# Patient Record
Sex: Male | Born: 2000 | Race: Black or African American | Hispanic: No | Marital: Single | Smoking: Never smoker
Health system: Southern US, Community
[De-identification: ages and names within clinical notes are randomized; demographics above are authoritative.]

## PROBLEM LIST (undated history)

## (undated) DIAGNOSIS — J45909 Unspecified asthma, uncomplicated: Secondary | ICD-10-CM

## (undated) HISTORY — PX: WISDOM TOOTH EXTRACTION: SHX21

---
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
## 2020-02-17 ENCOUNTER — Ambulatory Visit: Payer: Medicaid Other | Admitting: Physician Assistant

## 2020-02-17 ENCOUNTER — Other Ambulatory Visit: Payer: Self-pay

## 2020-02-17 ENCOUNTER — Encounter: Payer: Self-pay | Admitting: Physician Assistant

## 2020-02-17 DIAGNOSIS — Z113 Encounter for screening for infections with a predominantly sexual mode of transmission: Secondary | ICD-10-CM | POA: Diagnosis not present

## 2020-02-17 LAB — GRAM STAIN

## 2020-02-17 NOTE — Progress Notes (Signed)
Pt here for STD screening.Kristain Filo, RN 

## 2020-02-17 NOTE — Progress Notes (Signed)
Gram stain reviewed and is negative today, so no treatment needed for gram stain per standing order. Provider orders completed.Lyman Speller, RN

## 2020-02-19 NOTE — Progress Notes (Signed)
   [facility] Department STI clinic/screening visit  Subjective:  Adam Little is a 19 y.o. male being seen today for an STI screening visit. The patient reports they do not have symptoms.    Patient has the following medical conditions:   Patient Active Problem List   Diagnosis Date Noted  . Rhabdomyolysis 03/13/2017     Chief Complaint  Patient presents with  . SEXUALLY TRANSMITTED DISEASE    STD screening    HPI  Patient reports that he does not have any symptoms but would like a screening.  Never had HIV testing done before.  History of asthma and has an inhaler to use if needed.   See flowsheet for further details and programmatic requirements.    The following portions of the patient's history were reviewed and updated as appropriate: allergies, current medications, past medical history, past social history, past surgical history and problem list.  Objective:  There were no vitals filed for this visit.  Physical Exam Constitutional:      General: He is not in acute distress.    Appearance: Normal appearance.  HENT:     Head: Normocephalic and atraumatic.     Comments: No nits, lice, or hair loss. No cervical, supraclavicular or axillary adenopathy.    Mouth/Throat:     Mouth: Mucous membranes are moist.     Pharynx: Oropharynx is clear. No oropharyngeal exudate or posterior oropharyngeal erythema.  Eyes:     Conjunctiva/sclera: Conjunctivae normal.  Pulmonary:     Effort: Pulmonary effort is normal.  Abdominal:     Palpations: Abdomen is soft. There is no mass.     Tenderness: There is no abdominal tenderness. There is no guarding or rebound.  Genitourinary:    Penis: Normal.      Testes: Normal.     Comments: Pubic area without nits, lice, edema, erythema, lesions and inguinal adenopathy. Penis uncircumcised, without rash, lesion and discharge at meatus. Musculoskeletal:     Cervical back: Neck supple. No tenderness.  Skin:    General:  Skin is warm and dry.     Findings: No bruising, erythema, lesion or rash.  Neurological:     Mental Status: He is alert and oriented to person, place, and time.  Psychiatric:        Mood and Affect: Mood normal.        Thought Content: Thought content normal.        Judgment: Judgment normal.       Assessment and Plan:  Adam Little is a 19 y.o. male presenting to the [facility] Department for STI screening  1. Screening for STD (sexually transmitted disease) Patient into clinic without symptoms. Rec condoms with all sex. Await test results.  Counseled that RN will call if needs to RTC for treatment once results are back. - Gram stain - Gonococcus culture - HIV [place] [facility] - Syphilis Serology, [place] [facility] - Gonococcus culture     No follow-ups on file.  No future appointments.  Matt Holmes, PA

## 2020-02-22 LAB — GONOCOCCUS CULTURE

## 2020-09-21 DIAGNOSIS — B9729 Other coronavirus as the cause of diseases classified elsewhere: Secondary | ICD-10-CM | POA: Diagnosis not present

## 2020-09-21 DIAGNOSIS — R509 Fever, unspecified: Secondary | ICD-10-CM | POA: Diagnosis not present

## 2020-09-29 DIAGNOSIS — R509 Fever, unspecified: Secondary | ICD-10-CM | POA: Diagnosis not present

## 2020-09-29 DIAGNOSIS — B9729 Other coronavirus as the cause of diseases classified elsewhere: Secondary | ICD-10-CM | POA: Diagnosis not present

## 2020-12-22 ENCOUNTER — Emergency Department: Payer: BLUE CROSS/BLUE SHIELD

## 2020-12-22 ENCOUNTER — Other Ambulatory Visit: Payer: Self-pay

## 2020-12-22 ENCOUNTER — Emergency Department
Admission: EM | Admit: 2020-12-22 | Discharge: 2020-12-22 | Disposition: A | Discharge status: Home / Self Care | Payer: BLUE CROSS/BLUE SHIELD | Attending: Emergency Medicine | Admitting: Emergency Medicine

## 2020-12-22 DIAGNOSIS — J45909 Unspecified asthma, uncomplicated: Secondary | ICD-10-CM | POA: Insufficient documentation

## 2020-12-22 DIAGNOSIS — M25511 Pain in right shoulder: Secondary | ICD-10-CM | POA: Insufficient documentation

## 2020-12-22 DIAGNOSIS — X501XXA Overexertion from prolonged static or awkward postures, initial encounter: Secondary | ICD-10-CM | POA: Insufficient documentation

## 2020-12-22 DIAGNOSIS — Y9367 Activity, basketball: Secondary | ICD-10-CM | POA: Diagnosis not present

## 2020-12-22 MED ORDER — MELOXICAM 15 MG PO TABS: 15.0000 mg | ORAL_TABLET | Freq: Every day | ORAL | 2 refills | Status: DC

## 2020-12-22 NOTE — ED Notes (Signed)
Pt was at gym 3d ago and had injury to shoulder while playing basketball. R arm was bent backward. R shoulder looks different to him and he feels it "pop" when he moves it certain directions. Not in pain at this time but just feels "weird" when he moves his arm in certain ways. Pt in NAD at this time.

## 2020-12-22 NOTE — ED Provider Notes (Signed)
[facility]-EMERGENCY DEPARTMENT  ____________________________________________  Time seen: Approximately 7:42 PM  I have reviewed the triage vital signs and the nursing notes.   HISTORY  Chief Complaint Shoulder Pain   Historian Patient     HPI Adam Little is a 20 y.o. male presents to the emergency department with acute right shoulder pain.  Patient states that he was playing basketball and his shoulder was hyperextended behind him.  Patient states that his clavicle feels "weird to him".  He denies numbness or tingling in the right upper extremity.  No acute neck pain.  Patient denies chest pain, chest tightness or abdominal pain.   Past Medical History:  Diagnosis Date  . Asthma      Immunizations up to date:  Yes.     Past Medical History:  Diagnosis Date  . Asthma     Patient Active Problem List   Diagnosis Date Noted  . Rhabdomyolysis 03/13/2017    Past Surgical History:  Procedure Laterality Date  . OPEN REDUCTION INTERNAL FIXATION (ORIF) PROXIMAL PHALANX Left 05/21/2017   Procedure: OPEN REDUCTION INTERNAL FIXATION (ORIF) PROXIMAL PHALANX;  Surgeon: Kennedy Bucker, MD;  Location: [facility] ORS;  Service: Orthopedics;  Laterality: Left;  . WISDOM TOOTH EXTRACTION      Prior to Admission medications   Medication Sig Start Date End Date Taking? Authorizing Provider  meloxicam (MOBIC) 15 MG tablet Take 1 tablet (15 mg total) by mouth daily. 12/22/20 12/22/21 Yes Pia Mau M, PA-C  albuterol (PROVENTIL HFA;VENTOLIN HFA) 108 (90 BASE) MCG/ACT inhaler Inhale 1-2 puffs into the lungs every 6 (six) hours as needed for wheezing or shortness of breath.    [provider]  beclomethasone (QVAR) 40 MCG/ACT inhaler Inhale 1 puff into the lungs 2 (two) times daily as needed.     [provider]  HYDROcodone-acetaminophen (NORCO) 5-325 MG tablet Take 1-2 tablets by mouth every 6 (six) hours as needed for moderate pain. 05/21/17   Kennedy Bucker, MD     Allergies Patient has no known allergies.  No family history on file.  Social History Social History   Tobacco Use  . Smoking status: Never Smoker  . Smokeless tobacco: Never Used  Vaping Use  . Vaping Use: Never used  Substance Use Topics  . Alcohol use: No  . Drug use: No     Review of Systems  Constitutional: No fever/chills Eyes:  No discharge ENT: No upper respiratory complaints. Respiratory: no cough. No SOB/ use of accessory muscles to breath Gastrointestinal:   No nausea, no vomiting.  No diarrhea.  No constipation. Musculoskeletal: Patient has right shoulder pain.  Skin: Negative for rash, abrasions, lacerations, ecchymosis.    ____________________________________________   PHYSICAL EXAM:  VITAL SIGNS: ED Triage Vitals  Enc Vitals Group     BP 12/22/20 1726 125/76     Pulse Rate 12/22/20 1726 85     Resp 12/22/20 1726 14     Temp 12/22/20 1726 99 F (37.2 C)     Temp Source 12/22/20 1726 Oral     SpO2 12/22/20 1726 95 %     Weight 12/22/20 1723 175 lb (79.4 kg)     Height 12/22/20 1723 6' (1.829 m)     Head Circumference --      Peak Flow --      Pain Score 12/22/20 1723 0     Pain Loc --      Pain Edu? --      Excl. in GC? --  Constitutional: Alert and oriented. Well appearing and in no acute distress. Eyes: Conjunctivae are normal. PERRL. EOMI. Head: Atraumatic. ENT:      Nose: No congestion/rhinnorhea.      Mouth/Throat: Mucous membranes are moist.  Neck: No stridor.  FROM Hematological/Lymphatic/Immunilogical: No cervical lymphadenopathy. Cardiovascular: Normal rate, regular rhythm. Normal S1 and S2.  Good peripheral circulation. Respiratory: Normal respiratory effort without tachypnea or retractions. Lungs CTAB. Good air entry to the bases with no decreased or absent breath sounds Gastrointestinal: Bowel sounds x 4 quadrants. Soft and nontender to palpation. No guarding or rigidity. No distention. Musculoskeletal: Patient has  symmetric strength in the upper extremities.  No right rotator cuff weakness with testing.  Patient does have tenderness to palpation over right AC joint. Neurologic:  Normal for age. No gross focal neurologic deficits are appreciated.  Skin:  Skin is warm, dry and intact. No rash noted. Psychiatric: Mood and affect are normal for age. Speech and behavior are normal.   ____________________________________________   LABS (all labs ordered are listed, but only abnormal results are displayed)  Labs Reviewed - No data to display ____________________________________________  EKG   ____________________________________________  RADIOLOGY Geraldo Pitter, personally viewed and evaluated these images (plain radiographs) as part of my medical decision making, as well as reviewing the written report by the radiologist.  DG Shoulder Right  Result Date: 12/22/2020 CLINICAL DATA:  Right shoulder injury while lifting weight EXAM: RIGHT SHOULDER - 2+ VIEW COMPARISON:  None. FINDINGS: Slight elevation of the distal clavicle relative to the acromion albeit without significant overlying soft tissue swelling. No other acute or conspicuous osseous abnormality or traumatic malalignment. Included portions of the left chest wall are unremarkable. IMPRESSION: Slight elevation of the distal clavicle relative to the acromion, while this can sometimes be seen as a physiologic finding should correlate for point tenderness at the acromioclavicular joint to assess for a [place] type II AC joint injury. If there is continued clinical ambiguity, contralateral imaging could be obtained. Electronically Signed   By: Kreg Shropshire M.D.   On: 12/22/2020 17:58    ____________________________________________    PROCEDURES  Procedure(s) performed:     Procedures     Medications - No data to display   ____________________________________________   INITIAL IMPRESSION / ASSESSMENT AND PLAN / ED  COURSE  Pertinent labs & imaging results that were available during my care of the patient were reviewed by me and considered in my medical decision making (see chart for details).      Assessment and plan Right shoulder pain 20 year old male presents to the emergency department with acute right shoulder pain.  On exam, patient full range of motion of the right shoulder.  Rotator cuff weakness with testing.  Findings consistent with a AC separation X-ray of the right shoulder patient was placed in a sling for comfort and was discharged with meloxicam.  He was advised to follow-up with orthopedics as needed.     ____________________________________________  FINAL CLINICAL IMPRESSION(S) / ED DIAGNOSES  Final diagnoses:  Acute pain of right shoulder      NEW MEDICATIONS STARTED DURING THIS VISIT:  ED Discharge Orders         Ordered    meloxicam (MOBIC) 15 MG tablet  Daily        12/22/20 1843              This chart was dictated using voice recognition software/Dragon. Despite best efforts to proofread, errors can occur which can change  the meaning. Any change was purely unintentional.     Orvil Feil, PA-C 12/22/20 1949    Concha Se, MD 12/24/20 [phone]

## 2020-12-22 NOTE — ED Notes (Signed)
Sling applied to right shoulder.

## 2020-12-22 NOTE — Discharge Instructions (Addendum)
Take Meloxicam once daily for pain and inflammation.  

## 2020-12-22 NOTE — ED Triage Notes (Signed)
Pt to ER with possible right shoulder injury. Pt reports pain and tenderness at site that started Sunday while working out. Pt has full range of motion in extremity.

## 2020-12-22 NOTE — ED Notes (Signed)
Pt states mother is legal guardian.  Mother notified by this rn.  Mother reports she is the legal guardian.

## 2021-01-23 ENCOUNTER — Other Ambulatory Visit: Payer: Self-pay

## 2021-01-23 ENCOUNTER — Encounter: Payer: Self-pay | Admitting: Family Medicine

## 2021-01-23 ENCOUNTER — Ambulatory Visit: Payer: Medicaid Other | Admitting: Family Medicine

## 2021-01-23 DIAGNOSIS — Z113 Encounter for screening for infections with a predominantly sexual mode of transmission: Secondary | ICD-10-CM | POA: Diagnosis not present

## 2021-01-23 LAB — GRAM STAIN

## 2021-01-23 NOTE — Progress Notes (Signed)
   [facility] Department STI clinic/screening visit  Subjective:  Adam Little is a 20 y.o. male being seen today for an STI screening visit. The patient reports they do not have symptoms.    Patient has the following medical conditions:   Patient Active Problem List   Diagnosis Date Noted  . Rhabdomyolysis 03/13/2017     Chief Complaint  Patient presents with  . SEXUALLY TRANSMITTED DISEASE    Screening     HPI  Patient reports here for regular screening    See flowsheet for further details and programmatic requirements.    The following portions of the patient's history were reviewed and updated as appropriate: allergies, current medications, past medical history, past social history, past surgical history and problem list.  Objective:  There were no vitals filed for this visit.  Physical Exam Constitutional:      Appearance: Normal appearance.  HENT:     Head: Normocephalic.     Mouth/Throat:     Mouth: Mucous membranes are moist.     Pharynx: Oropharynx is clear. No oropharyngeal exudate.  Pulmonary:     Effort: Pulmonary effort is normal.  Genitourinary:    Penis: Normal.      Comments: No lice, nits, or pest, no lesions or odor discharge.  1 testicle, presented midline. Denies pain or tenderness with paplation of testicle.  No lesions, ulcers or masses present.   Musculoskeletal:     Cervical back: Normal range of motion.  Lymphadenopathy:     Cervical: No cervical adenopathy.  Skin:    General: Skin is warm and dry.     Findings: No bruising, erythema, lesion or rash.  Neurological:     Mental Status: He is alert and oriented to person, place, and time.  Psychiatric:        Mood and Affect: Mood normal.        Behavior: Behavior normal.       Assessment and Plan:  Adam Little is a 20 y.o. male presenting to the [facility] Department for STI screening  1. Screening examination for venereal disease  Patient does  not have STI symptoms Patient accepted all screenings including  Gram stain,  oral, urethral, GC and bloodwork for HIV/RPR.  Patient meets criteria for HepB screening? No. Ordered? No - declines  Patient meets criteria for HepC screening? Yes. Ordered? No - declines  Recommended condom use with all sex Discussed importance of condom use for STI prevent  Gram stain negative, no treatment needed.   Discussed time line for [facility] results and that patient will be called with positive results and encouraged patient to call if he had not heard in 2 weeks Recommended returning for continued or worsening symptoms.   - Gonococcus culture - Gram stain - Syphilis Serology, [place] [facility] - HIV [place] [facility] - Gonococcus culture    No follow-ups on file.  No future appointments.  Wendi Snipes, FNP

## 2021-01-27 LAB — GONOCOCCUS CULTURE

## 2021-01-30 LAB — HM HIV SCREENING LAB: HM HIV Screening: NEGATIVE

## 2021-02-21 ENCOUNTER — Emergency Department
Admission: EM | Admit: 2021-02-21 | Discharge: 2021-02-21 | Disposition: A | Discharge status: Home / Self Care | Payer: BC Managed Care – PPO | Attending: Emergency Medicine | Admitting: Emergency Medicine

## 2021-02-21 ENCOUNTER — Encounter: Payer: Self-pay | Admitting: Emergency Medicine

## 2021-02-21 ENCOUNTER — Other Ambulatory Visit: Payer: Self-pay

## 2021-02-21 DIAGNOSIS — J029 Acute pharyngitis, unspecified: Secondary | ICD-10-CM | POA: Diagnosis not present

## 2021-02-21 DIAGNOSIS — Z20822 Contact with and (suspected) exposure to covid-19: Secondary | ICD-10-CM | POA: Diagnosis not present

## 2021-02-21 DIAGNOSIS — B9789 Other viral agents as the cause of diseases classified elsewhere: Secondary | ICD-10-CM | POA: Diagnosis not present

## 2021-02-21 DIAGNOSIS — J45909 Unspecified asthma, uncomplicated: Secondary | ICD-10-CM | POA: Diagnosis not present

## 2021-02-21 LAB — RESP PANEL BY RT-PCR (FLU A&B, COVID) ARPGX2
Influenza A by PCR: NEGATIVE
Influenza B by PCR: NEGATIVE
SARS Coronavirus 2 by RT PCR: NEGATIVE

## 2021-02-21 LAB — GROUP A STREP BY PCR: Group A Strep by PCR: NOT DETECTED

## 2021-02-21 MED ORDER — IBUPROFEN 600 MG PO TABS
600.0000 mg | ORAL_TABLET | Freq: Once | ORAL | Status: AC
  Administered 2021-02-21: 600 mg via ORAL
  Filled 2021-02-21: qty 1

## 2021-02-21 MED ORDER — ACETAMINOPHEN 325 MG PO TABS
650.0000 mg | ORAL_TABLET | Freq: Once | ORAL | Status: AC | PRN
  Administered 2021-02-21: 650 mg via ORAL
  Filled 2021-02-21: qty 2

## 2021-02-21 NOTE — ED Provider Notes (Signed)
[facility] Emergency Department Provider Note  ____________________________________________   Event Date/Time   First MD Initiated Contact with Patient 02/21/21 1848     (approximate)  I have reviewed the triage vital signs and the nursing notes.   HISTORY  Chief Complaint Sore Throat  HPI Adam Little is a 20 y.o. male who presents to the emergency department for evaluation of acute illness.  Patient reports that when he awoke this morning he thought that his throat was kind of "scratchy".  He states that he initially took some Motrin upon waking up, continue to work.  After he got off around 11 AM, his pain continued and he thought it may be related to some nasal drainage and allergies and he does take a Claritin.  He reports his throat has continued to increase in pain and he then noticed fever of 102 at home after waking from a nap.  He denies any chest pain, cough, headache, body aches, chills.  He denies any known sick contacts.       Past Medical History:  Diagnosis Date  . Asthma     Patient Active Problem List   Diagnosis Date Noted  . Rhabdomyolysis 03/13/2017    Past Surgical History:  Procedure Laterality Date  . OPEN REDUCTION INTERNAL FIXATION (ORIF) PROXIMAL PHALANX Left 05/21/2017   Procedure: OPEN REDUCTION INTERNAL FIXATION (ORIF) PROXIMAL PHALANX;  Surgeon: Kennedy Bucker, MD;  Location: [facility] ORS;  Service: Orthopedics;  Laterality: Left;  . WISDOM TOOTH EXTRACTION      Prior to Admission medications   Medication Sig Start Date End Date Taking? Authorizing Provider  albuterol (PROVENTIL HFA;VENTOLIN HFA) 108 (90 BASE) MCG/ACT inhaler Inhale 1-2 puffs into the lungs every 6 (six) hours as needed for wheezing or shortness of breath.    [provider]  beclomethasone (QVAR) 40 MCG/ACT inhaler Inhale 1 puff into the lungs 2 (two) times daily as needed.     [provider]    Allergies Patient has no known  allergies.  No family history on file.  Social History Social History   Tobacco Use  . Smoking status: Never Smoker  . Smokeless tobacco: Never Used  Vaping Use  . Vaping Use: Never used  Substance Use Topics  . Alcohol use: No  . Drug use: Yes    Frequency: 4.0 times per week    Types: Marijuana    Review of Systems Constitutional: + fever/chills Eyes: No visual changes. ENT: + sore throat. Cardiovascular: Denies chest pain. Respiratory: Denies shortness of breath. Gastrointestinal: No abdominal pain.  No nausea, no vomiting.  No diarrhea.  No constipation. Genitourinary: Negative for dysuria. Musculoskeletal: Negative for back pain. Skin: Negative for rash. Neurological: Negative for headaches, focal weakness or numbness.   ____________________________________________   PHYSICAL EXAM:  VITAL SIGNS: ED Triage Vitals  Enc Vitals Group     BP 02/21/21 1755 (!) 121/94     Pulse Rate 02/21/21 1755 100     Resp 02/21/21 1755 17     Temp 02/21/21 1755 (!) 102.6 F (39.2 C)     Temp Source 02/21/21 1755 Oral     SpO2 02/21/21 1755 99 %     Weight 02/21/21 1742 175 lb 0.7 oz (79.4 kg)     Height 02/21/21 1742 6' (1.829 m)     Head Circumference --      Peak Flow --      Pain Score 02/21/21 1741 5     Pain  Loc --      Pain Edu? --      Excl. in GC? --     Constitutional: Alert and oriented. Well appearing and in no acute distress. Eyes: Conjunctivae are normal. PERRL. EOMI. Head: Atraumatic. Nose: No congestion/rhinnorhea. Mouth/Throat: Mucous membranes are moist.  Oropharynx erythematous with no tonsillar enlargement or exudates. Neck: No stridor.   Lymphatic: No cervical lymphadenopathy Cardiovascular: Normal rate, regular rhythm. Grossly normal heart sounds.  Good peripheral circulation. Respiratory: Normal respiratory effort.  No retractions. Lungs CTAB. Gastrointestinal: Soft and nontender. No distention. No abdominal bruits. No CVA  tenderness. Musculoskeletal: No lower extremity tenderness nor edema.  No joint effusions. Neurologic:  Normal speech and language. No gross focal neurologic deficits are appreciated. No gait instability. Skin:  Skin is warm, dry and intact. No rash noted. Psychiatric: Mood and affect are normal. Speech and behavior are normal.  ____________________________________________   LABS (all labs ordered are listed, but only abnormal results are displayed)  Labs Reviewed  RESP PANEL BY RT-PCR (FLU A&B, COVID) ARPGX2  GROUP A STREP BY PCR    ____________________________________________   INITIAL IMPRESSION / ASSESSMENT AND PLAN / ED COURSE  As part of my medical decision making, I reviewed the following data within the electronic MEDICAL RECORD NUMBER Nursing notes reviewed and incorporated, Labs reviewed and Notes from prior ED visits        Patient is a 20 year old male who presents to the emergency department for evaluation of sore throat and fever that began today.  See HPI for further details.  In triage, patient was febrile with a temperature of 102.6, otherwise vitals are within normal limits.  On physical exam he does have an erythematous oropharynx without any tonsillar involvement or exudates.  There is no cervical lymphadenopathy and auscultation of the heart and lungs is within normal limits.  Will obtain respiratory panel as well as strep swab.  Respiratory panel is negative for COVID and flu, negative strep swab.  At this time, exam and labs most consistent with viral pharyngitis.  Return precautions were discussed, particularly if he were to develop any worsening.  In the interim, recommended symptomatic management with Tylenol and ibuprofen for his symptoms.  Patient is amenable with this plan, and he stable this time for outpatient management.      ____________________________________________   FINAL CLINICAL IMPRESSION(S) / ED DIAGNOSES  Final diagnoses:  Viral  pharyngitis     ED Discharge Orders    None       Note:  This document was prepared using Dragon voice recognition software and may include unintentional dictation errors.   Lucy Chris, PA 02/21/21 2320    Delton Prairie, MD 02/22/21 1037

## 2021-02-21 NOTE — Discharge Instructions (Signed)
Please take tylenol, up to 1000mg  4x daily. You may also take ibuprofen, up to 800 mg 3x daily. Return to ER if you develop any difficulty swallowing or breathing, or any other significant changes. Otherwise, follow up with primary care.

## 2021-02-21 NOTE — ED Notes (Signed)
See triage note  Presents with sore throat and fever  States sx's started yesterday

## 2021-02-21 NOTE — ED Triage Notes (Signed)
C/O sore throat today and fever this afternoon.  Motrin taken this morning.  AAOx3.  Skin warm and dry. NAD. Voice clear and strong.

## 2021-02-27 DIAGNOSIS — J019 Acute sinusitis, unspecified: Secondary | ICD-10-CM | POA: Diagnosis not present

## 2021-02-27 DIAGNOSIS — J069 Acute upper respiratory infection, unspecified: Secondary | ICD-10-CM | POA: Diagnosis not present

## 2021-05-12 DIAGNOSIS — M79645 Pain in left finger(s): Secondary | ICD-10-CM | POA: Diagnosis not present

## 2021-05-12 DIAGNOSIS — Z9889 Other specified postprocedural states: Secondary | ICD-10-CM | POA: Diagnosis not present

## 2021-08-14 ENCOUNTER — Encounter: Payer: Self-pay | Admitting: Emergency Medicine

## 2021-08-14 ENCOUNTER — Other Ambulatory Visit: Payer: Self-pay

## 2021-08-14 ENCOUNTER — Emergency Department
Admission: EM | Admit: 2021-08-14 | Discharge: 2021-08-14 | Disposition: A | Discharge status: Home / Self Care | Payer: BC Managed Care – PPO | Attending: Emergency Medicine | Admitting: Emergency Medicine

## 2021-08-14 ENCOUNTER — Emergency Department: Payer: BC Managed Care – PPO

## 2021-08-14 DIAGNOSIS — J45909 Unspecified asthma, uncomplicated: Secondary | ICD-10-CM | POA: Diagnosis not present

## 2021-08-14 DIAGNOSIS — J101 Influenza due to other identified influenza virus with other respiratory manifestations: Secondary | ICD-10-CM | POA: Diagnosis not present

## 2021-08-14 DIAGNOSIS — R059 Cough, unspecified: Secondary | ICD-10-CM | POA: Diagnosis not present

## 2021-08-14 DIAGNOSIS — R0602 Shortness of breath: Secondary | ICD-10-CM | POA: Diagnosis not present

## 2021-08-14 DIAGNOSIS — Z20822 Contact with and (suspected) exposure to covid-19: Secondary | ICD-10-CM | POA: Diagnosis not present

## 2021-08-14 DIAGNOSIS — R509 Fever, unspecified: Secondary | ICD-10-CM | POA: Diagnosis present

## 2021-08-14 LAB — RESP PANEL BY RT-PCR (FLU A&B, COVID) ARPGX2
Influenza A by PCR: POSITIVE — AB
Influenza B by PCR: NEGATIVE
SARS Coronavirus 2 by RT PCR: NEGATIVE

## 2021-08-14 NOTE — ED Provider Notes (Signed)
Emergency Medicine Provider Triage Evaluation Note  Adam Little , a 20 y.o. male  was evaluated in triage.  Pt complains of fever, cough, shortness of breath, body aches.  Tested positive for the flu 2 days ago.  Picked up Tamiflu today but did not start it.  Patient presents with worsening symptoms after his initial diagnosis.  No emesis, diarrhea.  No difficulty breathing.  No chest pain..  Review of Systems  Positive: Fever, cough, body aches, shortness of breath Negative: Brein difficulty breathing, difficulty swallowing, GI complaints  Physical Exam  Ht 6\' 1"  (1.854 m)   Wt 72.6 kg   BMI 21.11 kg/m  Gen:   Awake, no distress   Resp:  Normal effort  MSK:   Moves extremities without difficulty  Other:    Medical Decision Making  Medically screening exam initiated at 4:29 PM.  Appropriate orders placed.  was informed that the remainder of the evaluation will be completed by another provider, this initial triage assessment does not replace that evaluation, and the importance of remaining in the ED until their evaluation is complete.  Patient arrives flu positive.  Worsening symptoms.  Patient will have repeat swab and chest x-ray   Alethia Berthold, PA-C 08/14/21 1629    08/16/21, MD 08/14/21 1710

## 2021-08-14 NOTE — ED Triage Notes (Signed)
Pt reports fever, congestion, cough and now feels SOB. States took a flu test several days ago that was negative.

## 2021-08-14 NOTE — Discharge Instructions (Signed)
Alternate Tylenol and ibuprofen for fever as needed.  Continue your Tamiflu.  Take over-the-counter Mucinex for congestion.  Return if worsening

## 2021-08-14 NOTE — ED Provider Notes (Signed)
[facility] Emergency Department Provider Note  ____________________________________________   Event Date/Time   First MD Initiated Contact with Patient 08/14/21 1734     (approximate)  I have reviewed the triage vital signs and the nursing notes.   HISTORY  Chief Complaint Fever, Cough, and Shortness of Breath    HPI DERMOT GREMILLION is a 20 y.o. maleflulike symptoms, patient is complained of fever, chills, body aches.  cough, sore throat, denies vomiting, diarrhea; denies chest pain or sob.  Sx for 3 days, patient was seen at urgent care yesterday and had a negative flu test.  States they told him he has to flu and gave him a prescription for Tamiflu.  Comes to the emergency department today because he states my chest hurts.   Past Medical History:  Diagnosis Date   Asthma     Patient Active Problem List   Diagnosis Date Noted   Rhabdomyolysis 03/13/2017    Past Surgical History:  Procedure Laterality Date   OPEN REDUCTION INTERNAL FIXATION (ORIF) PROXIMAL PHALANX Left 05/21/2017   Procedure: OPEN REDUCTION INTERNAL FIXATION (ORIF) PROXIMAL PHALANX;  Surgeon: Kennedy Bucker, MD;  Location: [facility] ORS;  Service: Orthopedics;  Laterality: Left;   WISDOM TOOTH EXTRACTION      Prior to Admission medications   Medication Sig Start Date End Date Taking? Authorizing Provider  albuterol (PROVENTIL HFA;VENTOLIN HFA) 108 (90 BASE) MCG/ACT inhaler Inhale 1-2 puffs into the lungs every 6 (six) hours as needed for wheezing or shortness of breath.    [provider]  beclomethasone (QVAR) 40 MCG/ACT inhaler Inhale 1 puff into the lungs 2 (two) times daily as needed.     [provider]    Allergies Patient has no known allergies.  No family history on file.  Social History Social History   Tobacco Use   Smoking status: Never   Smokeless tobacco: Never  Vaping Use   Vaping Use: Never used  Substance Use Topics   Alcohol use: No    Drug use: Yes    Frequency: 4.0 times per week    Types: Marijuana    Review of Systems  Constitutional: Positive fever/chills Eyes: No visual changes. ENT: Positive sore throat. Cardiovascular: Positive chest pain Respiratory: Positive cough Gastrointestinal: Denies abdominal pain, nausea/vomiting/diarrhea Genitourinary: Negative for dysuria. Musculoskeletal: Negative for back pain. Skin: Negative for rash.    ____________________________________________   PHYSICAL EXAM:  VITAL SIGNS: ED Triage Vitals  Enc Vitals Group     BP 08/14/21 1628 (!) 152/97     Pulse Rate 08/14/21 1628 89     Resp 08/14/21 1628 20     Temp 08/14/21 1628 (!) 100.9 F (38.3 C)     Temp Source 08/14/21 1628 Oral     SpO2 08/14/21 1628 100 %     Weight 08/14/21 1627 160 lb (72.6 kg)     Height 08/14/21 1627 6\' 1"  (1.854 m)     Head Circumference --      Peak Flow --      Pain Score 08/14/21 1627 0     Pain Loc --      Pain Edu? --      Excl. in GC? --     Constitutional: Alert and oriented. Well appearing and in no acute distress. Eyes: Conjunctivae are normal.  Head: Atraumatic. Nose: No congestion/rhinnorhea. Mouth/Throat: Mucous membranes are moist.   Neck:  supple no lymphadenopathy noted Cardiovascular: Normal rate, regular rhythm. Heart sounds are normal Respiratory: Normal  respiratory effort.  No retractions, lungs c t a  Abd: soft nontender bs normal all 4 quad GU: deferred Musculoskeletal: FROM all extremities, warm and well perfused Neurologic:  Normal speech and language.  Skin:  Skin is warm, dry and intact. No rash noted. Psychiatric: Mood and affect are normal. Speech and behavior are normal.  ____________________________________________   LABS (all labs ordered are listed, but only abnormal results are displayed)  Labs Reviewed  RESP PANEL BY RT-PCR (FLU A&B, COVID) ARPGX2 - Abnormal; Notable for the following components:      Result Value   Influenza A by PCR  POSITIVE (*)    All other components within normal limits   ____________________________________________   ____________________________________________  RADIOLOGY  Chest x-ray  ____________________________________________   PROCEDURES  Procedure(s) performed: No  Procedures    ____________________________________________   INITIAL IMPRESSION / ASSESSMENT AND PLAN / ED COURSE  Pertinent labs & imaging results that were available during my care of the patient were reviewed by me and considered in my medical decision making (see chart for details).   The patient is a 20 year old male presents emergency department with flulike symptoms.  See HPI.  Physical exam shows patient be stable.  Chest x-ray due to the complaint of chest pain with influenza,  Chest x-ray was reviewed by me confirmed by radiology be negative for any acute abnormality  Respiratory panel was positive for influenza A  I did explain the findings to the patient.  Reassurance was given that he can continue his Tamiflu.  Take over-the-counter Mucinex for cough and congestion.  Drink plenty of fluids.  Take Tylenol and ibuprofen for fever.  Return emergency department worsening.  Patient is in agreement with treatment plan.  Is discharged stable condition.     As part of my medical decision making, I reviewed the following data within the electronic MEDICAL RECORD NUMBER Nursing notes reviewed and incorporated, Labs reviewed , Old chart reviewed, Radiograph reviewed , Notes from prior ED visits, and [place] Controlled Substance Database  ____________________________________________   FINAL CLINICAL IMPRESSION(S) / ED DIAGNOSES  Final diagnoses:  Influenza A      NEW MEDICATIONS STARTED DURING THIS VISIT:  New Prescriptions   No medications on file    FREEMAN BORBA was evaluated in Emergency Department on 08/14/2021 for the symptoms described in the history of present illness. He was evaluated in the  context of the global COVID-19 pandemic, which necessitated consideration that the patient might be at risk for infection with the SARS-CoV-2 virus that causes COVID-19. Institutional protocols and algorithms that pertain to the evaluation of patients at risk for COVID-19 are in a state of rapid change based on information released by regulatory bodies including the CDC and federal and state organizations. These policies and algorithms were followed during the patient's care in the ED.   Note:  This document was prepared using Dragon voice recognition software and may include unintentional dictation errors.    Faythe Ghee, PA-C 08/14/21 Lynelle Smoke    Jene Every, MD 08/18/21 1325

## 2021-11-23 DIAGNOSIS — A084 Viral intestinal infection, unspecified: Secondary | ICD-10-CM | POA: Diagnosis not present

## 2021-12-12 ENCOUNTER — Other Ambulatory Visit: Payer: Self-pay

## 2021-12-12 ENCOUNTER — Encounter: Payer: Self-pay | Admitting: Emergency Medicine

## 2021-12-12 ENCOUNTER — Emergency Department
Admission: EM | Admit: 2021-12-12 | Discharge: 2021-12-12 | Disposition: A | Discharge status: Home / Self Care | Payer: BC Managed Care – PPO | Attending: Emergency Medicine | Admitting: Emergency Medicine

## 2021-12-12 DIAGNOSIS — Y990 Civilian activity done for income or pay: Secondary | ICD-10-CM | POA: Diagnosis not present

## 2021-12-12 DIAGNOSIS — J45909 Unspecified asthma, uncomplicated: Secondary | ICD-10-CM | POA: Diagnosis not present

## 2021-12-12 DIAGNOSIS — S20229A Contusion of unspecified back wall of thorax, initial encounter: Secondary | ICD-10-CM

## 2021-12-12 DIAGNOSIS — W228XXA Striking against or struck by other objects, initial encounter: Secondary | ICD-10-CM | POA: Diagnosis not present

## 2021-12-12 DIAGNOSIS — S299XXA Unspecified injury of thorax, initial encounter: Secondary | ICD-10-CM | POA: Diagnosis present

## 2021-12-12 DIAGNOSIS — S20222A Contusion of left back wall of thorax, initial encounter: Secondary | ICD-10-CM | POA: Diagnosis not present

## 2021-12-12 DIAGNOSIS — S300XXA Contusion of lower back and pelvis, initial encounter: Secondary | ICD-10-CM | POA: Diagnosis not present

## 2021-12-12 MED ORDER — IBUPROFEN 800 MG PO TABS: 800.0000 mg | ORAL_TABLET | Freq: Three times a day (TID) | ORAL | 0 refills | Status: DC | PRN

## 2021-12-12 NOTE — ED Notes (Signed)
See triage note. Pt works for [employer] and states while at work hit back coming off truck at mid-left next to spine and has experienced "tightness" at site since then. Denies numbness, tingling, or alteration in limbs. Denies that tightness gets worse or better with movement; states tried heating pad and icee-hot at home with minimal relief. States has had tightness for 4 days now. Pt in NAD. Ambulatory to room; steady.  ?

## 2021-12-12 NOTE — Discharge Instructions (Signed)
Follow-up with your primary care provider or urgent care if any continued problems.  You may use ice or heat to your muscles as needed for discomfort.  Also the IcyHot can be continued but I would discontinue massages for this time as it could aggravate the muscles.  Avoids sports at this time.  Take ibuprofen 800 mg 3 times daily with food for the next 5 days which should help with your discomfort and inflammation. ?

## 2021-12-12 NOTE — ED Triage Notes (Signed)
Pt states last Wednesday at work he stepped back and hit his back on a door latch, pt states that he cont to have tightness to the left side of his back ?

## 2021-12-12 NOTE — ED Provider Notes (Signed)
? ?[facility] ?Provider Note ? ? ? Event Date/Time  ? First MD Initiated Contact with Patient 12/12/21 1150   ?  (approximate) ? ? ?History  ? ?Back Pain ? ? ?HPI ? ?Adam Little is a 21 y.o. male   presents to the ED with complaint of back pain after he stepped back and hit his back on the latch of a door.  Patient states that he did not fall into the door but bumped into the latch.  He denies any difficulty with range of motion or increased pain with range of motion.  He took 2 over-the-counter ibuprofen and states that it is somewhat better.  He denies any hematuria.  Patient is a non-smoker and has history of asthma.  He rates his pain a 0/10. ? ?  ? ? ?Physical Exam  ? ?Triage Vital Signs: ?ED Triage Vitals [12/12/21 1136]  ?Enc Vitals Group  ?   BP 114/83  ?   Pulse Rate 82  ?   Resp 16  ?   Temp 98 ?F (36.7 ?C)  ?   Temp Source Oral  ?   SpO2 100 %  ?   Weight 165 lb (74.8 kg)  ?   Height 6' (1.829 m)  ?   Head Circumference   ?   Peak Flow   ?   Pain Score 0  ?   Pain Loc   ?   Pain Edu?   ?   Excl. in GC?   ? ? ?Most recent vital signs: ?Vitals:  ? 12/12/21 1136 12/12/21 1141  ?BP: 114/83 135/75  ?Pulse: 82 71  ?Resp: 16 16  ?Temp: 98 ?F (36.7 ?C)   ?SpO2: 100% 99%  ? ? ? ?General: Awake, no distress.   ?CV:  Good peripheral perfusion.  Heart regular rate and rhythm. ?Resp:  Normal effort.  Clear bilaterally. ?Abd:  No distention.  ?Other:  Examination of the back showed no soft tissue edema or discoloration.  There is no tenderness on palpation of the thoracic or lumbar spine.  Patient points to approximately the left paravertebral muscles near T11-T12 area.  No CVA tenderness is appreciated.  Patient has full range of motion without difficulty.  Patient is able to stand and ambulate without any assistance. ? ? ?ED Results / Procedures / Treatments  ? ?Labs ?(all labs ordered are listed, but only abnormal results are displayed) ?Labs Reviewed - No data to  display ? ? ? ?PROCEDURES: ? ?Critical Care performed:  ? ?Procedures ? ? ?MEDICATIONS ORDERED IN ED: ?Medications - No data to display ? ? ?IMPRESSION / MDM / ASSESSMENT AND PLAN / ED COURSE  ?I reviewed the triage vital signs and the nursing notes. ? ? ?Differential diagnosis includes, but is not limited to, contusion back.  Fractured rib. ? ?21 year old male presents to the ED with complaint of back pain after he backed into the latch of a door.  Patient has taken ibuprofen 400 mg and states that this has helped and he has no difficulty with range of motion and has not in any way changed his routine.  Physical exam was benign and no point tenderness was appreciated.  Patient was reassured.  He was made aware that the adult strength ibuprofen was sent to the pharmacy for him to take with food every 8 hours if needed and that he may use ice or heat to his back as needed for discomfort. ? ? ?FINAL CLINICAL IMPRESSION(S) /  ED DIAGNOSES  ? ?Final diagnoses:  ?Contusion of back, unspecified laterality, initial encounter  ? ? ? ?Rx / DC Orders  ? ?ED Discharge Orders   ? ?      Ordered  ?  ibuprofen (ADVIL) 800 MG tablet  Every 8 hours PRN       ? 12/12/21 1245  ? ?  ?  ? ?  ? ? ? ?Note:  This document was prepared using Dragon voice recognition software and may include unintentional dictation errors. ?  ?Tommi Rumps, PA-C ?12/12/21 1328 ? ?  ?Dionne Bucy, MD ?12/12/21 1538 ? ?

## 2022-06-14 ENCOUNTER — Telehealth: Payer: Self-pay

## 2022-06-14 DIAGNOSIS — M940 Chondrocostal junction syndrome [Tietze]: Secondary | ICD-10-CM | POA: Diagnosis not present

## 2022-06-14 DIAGNOSIS — R0789 Other chest pain: Secondary | ICD-10-CM | POA: Diagnosis not present

## 2022-06-14 DIAGNOSIS — R079 Chest pain, unspecified: Secondary | ICD-10-CM | POA: Diagnosis not present

## 2022-06-14 NOTE — Telephone Encounter (Signed)
Patient called to connect with a Primary Care Provider. Unable to leave VM, VM not set up/VM full. Patient has Managed Medicaid. If patient returns call, please reach out to Sarah or Harlow Carrizales, RN.   

## 2022-06-22 ENCOUNTER — Telehealth: Payer: Self-pay

## 2022-06-22 NOTE — Telephone Encounter (Signed)
Rached out to pt.to set up Managed Medicaid PCP. Pt. Will think about it and call back. Number given.

## 2022-06-27 ENCOUNTER — Telehealth: Payer: Self-pay

## 2022-06-27 NOTE — Telephone Encounter (Signed)
Called pt and after asking to speak to him he hung up. If pt does call back , please send call to Magda Paganini or Judson Roch. Pt has Managed Medicare and needs a PCP.

## 2022-07-05 DIAGNOSIS — R0789 Other chest pain: Secondary | ICD-10-CM | POA: Diagnosis not present

## 2022-07-05 DIAGNOSIS — R079 Chest pain, unspecified: Secondary | ICD-10-CM | POA: Diagnosis not present

## 2022-07-05 DIAGNOSIS — J45909 Unspecified asthma, uncomplicated: Secondary | ICD-10-CM | POA: Diagnosis not present

## 2022-07-05 DIAGNOSIS — Z7951 Long term (current) use of inhaled steroids: Secondary | ICD-10-CM | POA: Diagnosis not present

## 2022-07-30 IMAGING — CR DG SHOULDER 2+V*R*
1 series · 3 of 3 positions shown · non-contrast
Comparison: None.

CLINICAL DATA: Right shoulder injury while lifting weight

EXAM:
RIGHT SHOULDER - 2+ VIEW

[Series 1: dg shoulder right · 0.14mm/px · 3 of 3 slices shown]
[im 1/3]
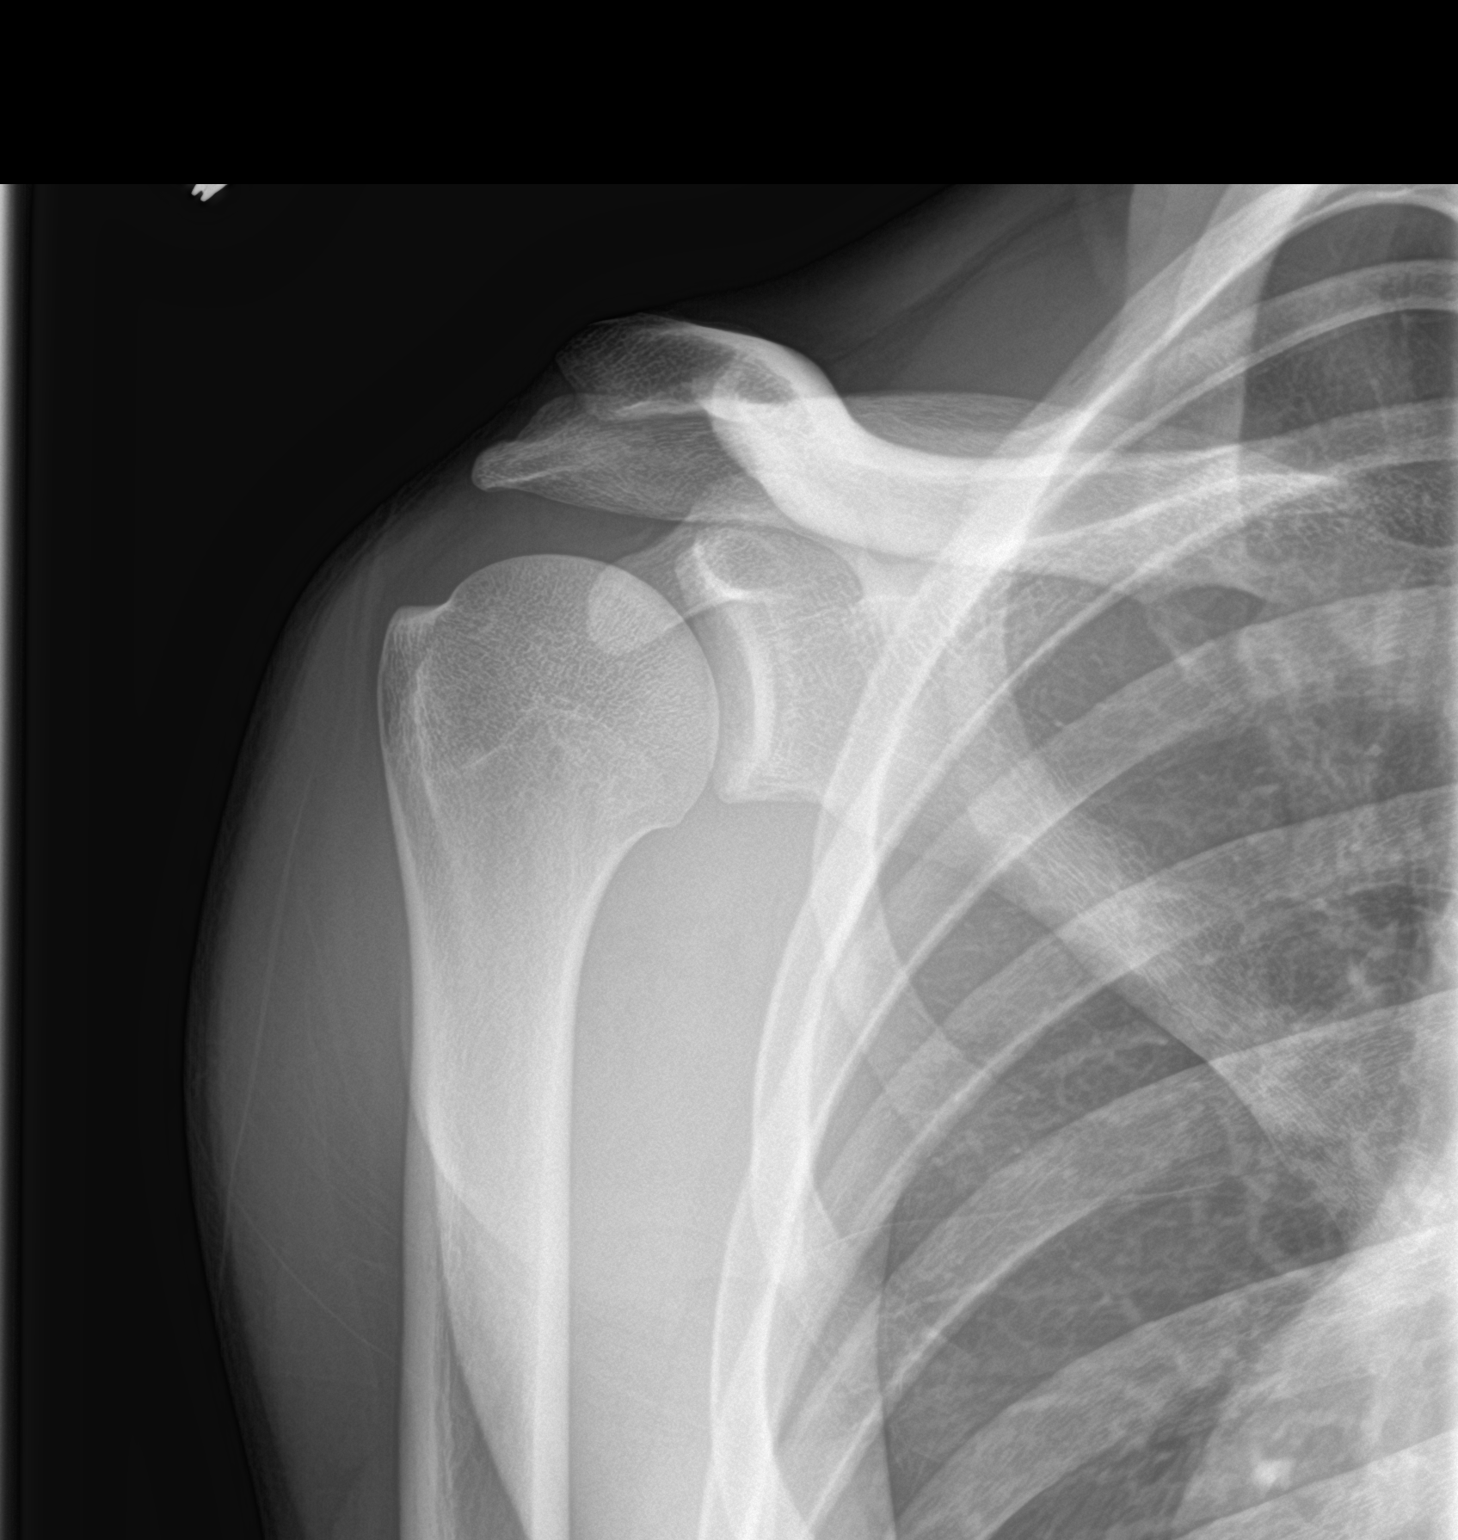
[im 2/3]
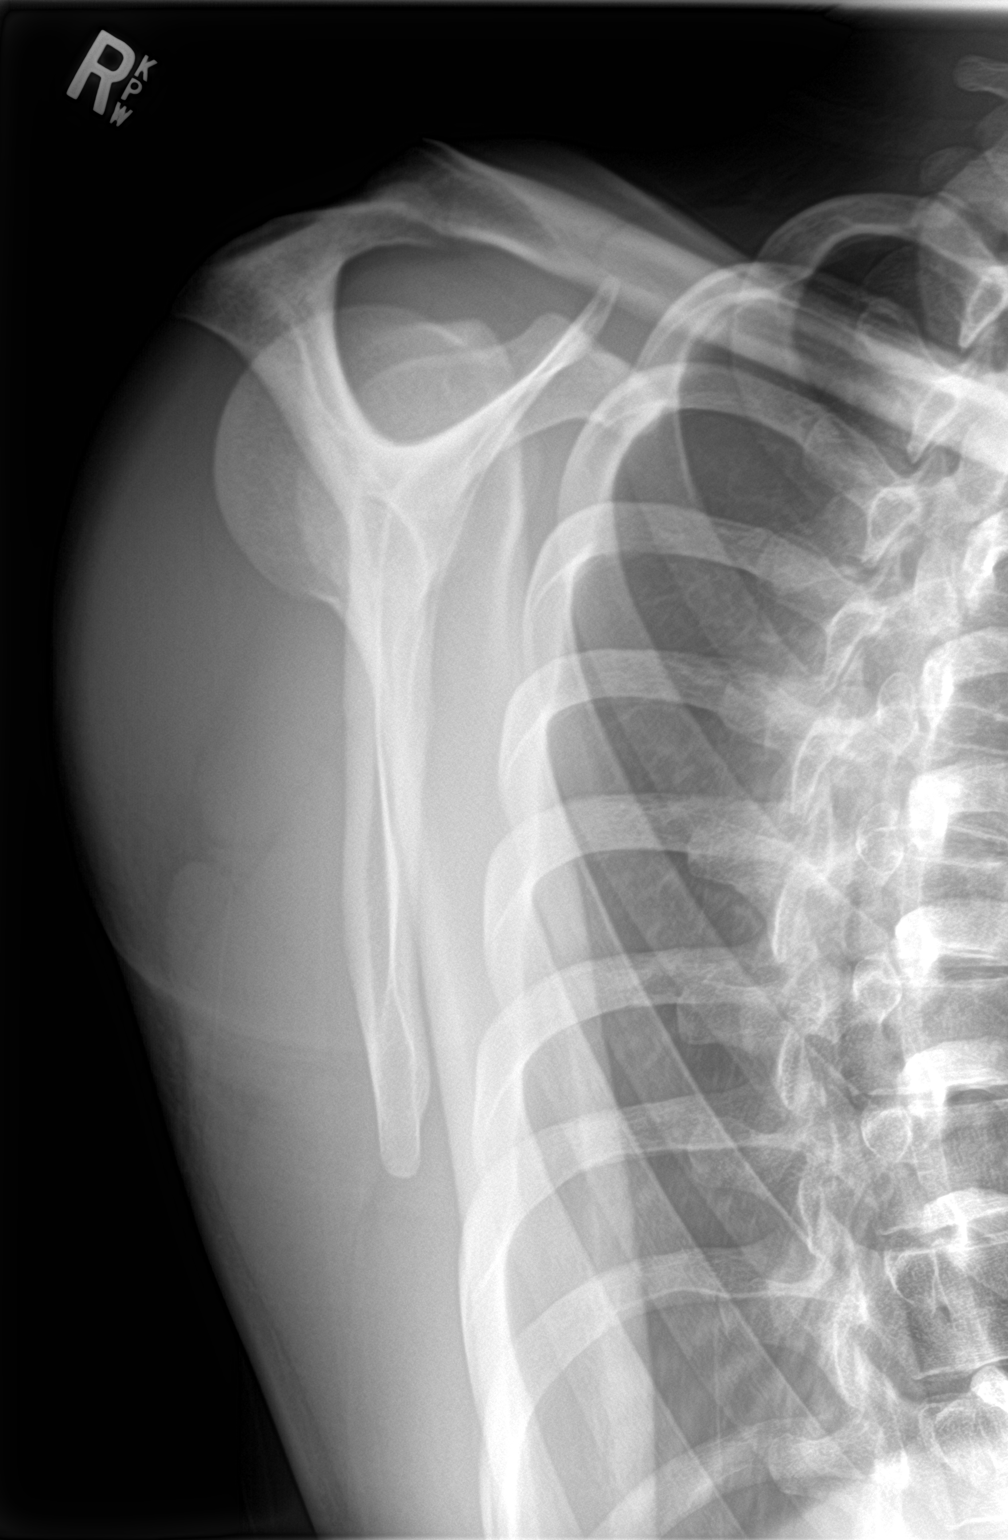
[im 3/3]
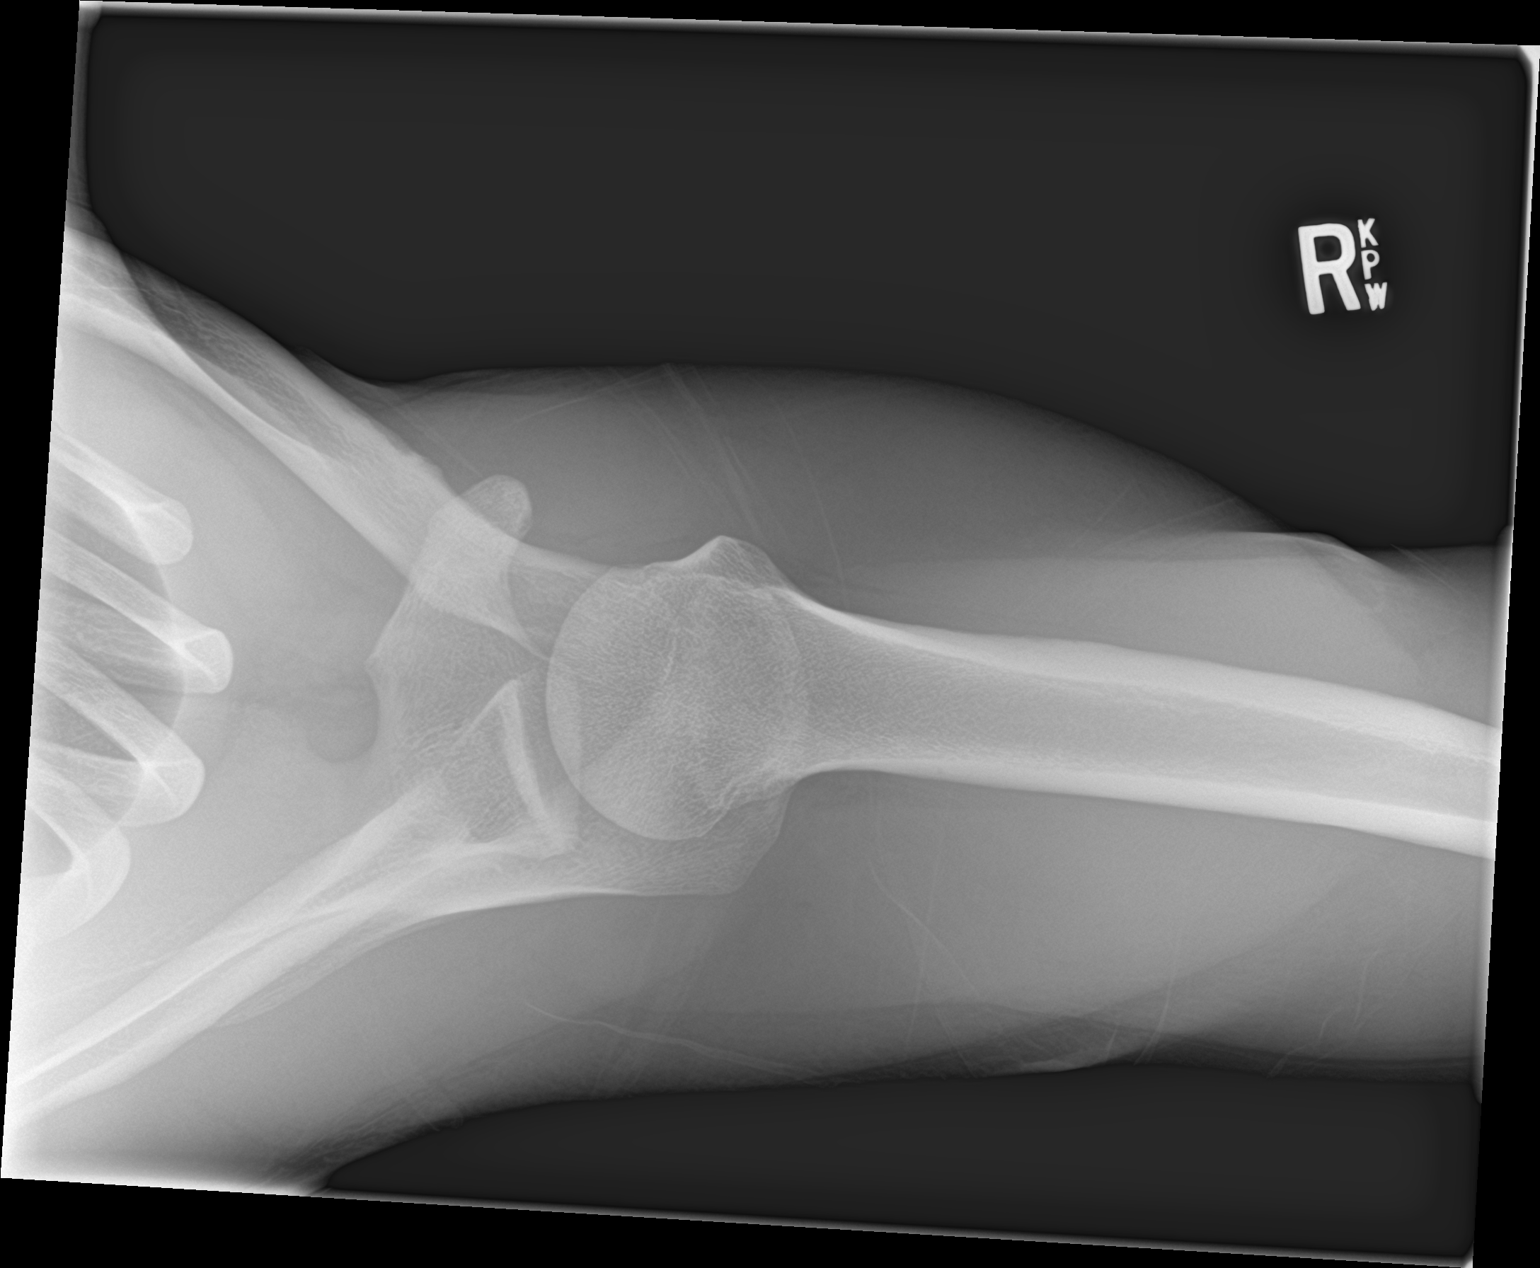

[3 of 3 positions shown; findings below may reference images not displayed]

FINDINGS: Slight elevation of the distal clavicle relative to the acromion
albeit without significant overlying soft tissue swelling. No other
acute or conspicuous osseous abnormality or traumatic malalignment.
Included portions of the left chest wall are unremarkable.
IMPRESSION: Slight elevation of the distal clavicle relative to the acromion,
while this can sometimes be seen as a physiologic finding should
correlate for point tenderness at the acromioclavicular joint to
assess for Nya Jumper type II AC joint injury. If there is continued
clinical ambiguity, contralateral imaging could be obtained.

## 2022-11-08 DIAGNOSIS — R509 Fever, unspecified: Secondary | ICD-10-CM | POA: Diagnosis not present

## 2022-11-08 DIAGNOSIS — J101 Influenza due to other identified influenza virus with other respiratory manifestations: Secondary | ICD-10-CM | POA: Diagnosis not present

## 2022-11-08 DIAGNOSIS — Z03818 Encounter for observation for suspected exposure to other biological agents ruled out: Secondary | ICD-10-CM | POA: Diagnosis not present

## 2022-11-18 ENCOUNTER — Emergency Department: Payer: BC Managed Care – PPO

## 2022-11-18 ENCOUNTER — Other Ambulatory Visit: Payer: Self-pay

## 2022-11-18 ENCOUNTER — Emergency Department
Admission: EM | Admit: 2022-11-18 | Discharge: 2022-11-18 | Disposition: A | Discharge status: Home / Self Care | Payer: BC Managed Care – PPO | Attending: Emergency Medicine | Admitting: Emergency Medicine

## 2022-11-18 ENCOUNTER — Encounter: Payer: Self-pay | Admitting: Emergency Medicine

## 2022-11-18 DIAGNOSIS — S0993XA Unspecified injury of face, initial encounter: Secondary | ICD-10-CM | POA: Diagnosis present

## 2022-11-18 DIAGNOSIS — S0083XA Contusion of other part of head, initial encounter: Secondary | ICD-10-CM | POA: Diagnosis not present

## 2022-11-18 DIAGNOSIS — Y990 Civilian activity done for income or pay: Secondary | ICD-10-CM | POA: Diagnosis not present

## 2022-11-18 DIAGNOSIS — W228XXA Striking against or struck by other objects, initial encounter: Secondary | ICD-10-CM | POA: Insufficient documentation

## 2022-11-18 DIAGNOSIS — R519 Headache, unspecified: Secondary | ICD-10-CM | POA: Diagnosis not present

## 2022-11-18 NOTE — ED Provider Notes (Signed)
[facility] Provider Note    Event Date/Time   First MD Initiated Contact with Patient 11/18/22 1020     (approximate)   History   Head Injury   HPI  Adam Little is a 22 y.o. male   presents to the ED with of pressure to his forehead after hitting his head while at work a couple days ago on a visor while getting into his truck.  Patient states he works for [employer].  He denies any loss of consciousness or did call to with his vision.  No nausea or vomiting is reported.      Physical Exam   Triage Vital Signs: ED Triage Vitals  Enc Vitals Group     BP 11/18/22 0943 131/81     Pulse Rate 11/18/22 0943 82     Resp 11/18/22 0943 16     Temp 11/18/22 0943 97.8 F (36.6 C)     Temp Source 11/18/22 0943 Oral     SpO2 11/18/22 0943 98 %     Weight 11/18/22 0939 167 lb (75.8 kg)     Height 11/18/22 0939 6' (1.829 m)     Head Circumference --      Peak Flow --      Pain Score 11/18/22 0939 3     Pain Loc --      Pain Edu? --      Excl. in [place]? --     Most recent vital signs: Vitals:   11/18/22 0943  BP: 131/81  Pulse: 82  Resp: 16  Temp: 97.8 F (36.6 C)  SpO2: 98%     General: Awake, no distress.  Alert, oriented, talkative, able to stand and walk without any assistance. CV:  Good peripheral perfusion.  Resp:  Normal effort.  Abd:  No distention.  Other:  On physical exam of the forehead and facial area, there is no gross deformity or soft tissue edema appreciated.  No skin abrasions or discoloration present.   ED Results / Procedures / Treatments   Labs (all labs ordered are listed, but only abnormal results are displayed) Labs Reviewed - No data to display   RADIOLOGY  CT head without contrast per radiologist is negative for intracranial injury or fracture.   PROCEDURES:  Critical Care performed:   Procedures   MEDICATIONS ORDERED IN ED: Medications - No data to display   IMPRESSION / MDM / [place] /  ED COURSE  I reviewed the triage vital signs and the nursing notes.   Differential diagnosis includes, but is not limited to, facial contusion, head injury.  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  There was no loss of consciousness and he has not not experienced any visual changes, nausea or vomiting.  CT scan was reassuring and patient was made aware that there was no intracranial injury.  He will continue with ice if needed for soft tissue discomfort.  He is encouraged to take Tylenol if needed and follow-up with his PCP if any continued problems.      Patient's presentation is most consistent with acute complicated illness / injury requiring diagnostic workup.  FINAL CLINICAL IMPRESSION(S) / ED DIAGNOSES   Final diagnoses:  Contusion of face, initial encounter     Rx / DC Orders   ED Discharge Orders     None  Note:  This document was prepared using Dragon voice recognition software and may include unintentional dictation errors.   Johnn Hai, PA-C 11/18/22 1613    Carrie Mew, MD 11/19/22 [phone]

## 2022-11-18 NOTE — Discharge Instructions (Signed)
You may apply ice to your forehead as needed for swelling.  Tylenol if needed for pain.  Your CT scan does not show any injury to your brain.  Follow-up with your primary care provider if any continued problems or return to the emergency department if any severe worsening of your symptoms or urgent concerns.

## 2022-11-18 NOTE — ED Triage Notes (Signed)
Pt states that he hit his head the other day at work. Pt states that he is having pressure in his head since then and wanted to be checked out. Pt denies any other symptoms. Pt is in NAD.

## 2023-01-17 ENCOUNTER — Encounter: Payer: Self-pay | Admitting: Family Medicine

## 2023-01-17 ENCOUNTER — Ambulatory Visit: Payer: Medicaid Other | Admitting: Family Medicine

## 2023-01-17 DIAGNOSIS — Z113 Encounter for screening for infections with a predominantly sexual mode of transmission: Secondary | ICD-10-CM

## 2023-01-17 DIAGNOSIS — B3742 Candidal balanitis: Secondary | ICD-10-CM

## 2023-01-17 LAB — HM HIV SCREENING LAB: HM HIV Screening: NEGATIVE

## 2023-01-17 MED ORDER — CLOTRIMAZOLE-BETAMETHASONE 1-0.05 % EX CREA: 1.0000 | TOPICAL_CREAM | Freq: Two times a day (BID) | CUTANEOUS | 0 refills | Status: AC

## 2023-01-17 NOTE — Progress Notes (Signed)
[facility] Department STI clinic/screening visit  Subjective:  Adam Little is a 22 y.o. male being seen today for an STI screening visit. The patient reports they do have symptoms.    Patient has the following medical conditions:   Patient Active Problem List   Diagnosis Date Noted   Rhabdomyolysis 03/13/2017     Chief Complaint  Patient presents with   SEXUALLY TRANSMITTED DISEASE    Rash on penis    HPI  Patient reports to clinic for rash on her penis. States this has been there about 1 week. Endorses that a few weeks ago he changed his body wash. Only 1 sexual partner. Last sex 1 week ago without condoms. Denies itching on rash.   Last HIV test per patient/review of record was  Lab Results  Component Value Date   HMHIVSCREEN Negative - Validated 01/30/2021    Lab Results  Component Value Date   HIV Non Reactive 03/14/2017    Does the patient or their partner desires a pregnancy in the next year? No  Screening for MPX risk: Does the patient have an unexplained rash? No Is the patient MSM? No Does the patient endorse multiple sex partners or anonymous sex partners? No Did the patient have close or sexual contact with a person diagnosed with MPX? No Has the patient traveled outside the [place] where MPX is endemic? No Is there a high clinical suspicion for MPX-- evidenced by one of the following No  -Unlikely to be chickenpox  -Lymphadenopathy  -Rash that present in same phase of evolution on any given body part   See flowsheet for further details and programmatic requirements.    There is no immunization history on file for this patient.   The following portions of the patient's history were reviewed and updated as appropriate: allergies, current medications, past medical history, past social history, past surgical history and problem list.  Objective:  There were no vitals filed for this visit.  Physical Exam Constitutional:      Appearance:  Normal appearance.  HENT:     Head: Normocephalic and atraumatic.     Comments: No nits or hair loss    Mouth/Throat:     Mouth: Mucous membranes are moist. No oral lesions.     Pharynx: Oropharynx is clear. No oropharyngeal exudate or posterior oropharyngeal erythema.  Eyes:     General:        Right eye: No discharge.        Left eye: No discharge.     Conjunctiva/sclera:     Right eye: Right conjunctiva is not injected. No exudate.    Left eye: Left conjunctiva is not injected. No exudate. Pulmonary:     Effort: Pulmonary effort is normal.  Abdominal:     General: Abdomen is flat.     Palpations: Abdomen is soft. There is no hepatomegaly or mass.     Tenderness: There is no abdominal tenderness. There is no rebound.     Hernia: There is no hernia in the left inguinal area or right inguinal area.  Genitourinary:    Pubic Area: No rash or pubic lice (no nits).      Penis: Uncircumcised. Erythema present. No tenderness, discharge, swelling or lesions.      Testes: Normal.     Epididymis:     Right: Normal. No mass or tenderness.     Left: Normal. No mass or tenderness.     Tanner stage (genital): 5.  Rectum: Normal. No tenderness (no lesions or discharge).       Comments: Penile Discharge Amount: none Color:  none  Contact dermatitis on right shaft of penis Lymphadenopathy:     Head:     Right side of head: No preauricular or posterior auricular adenopathy.     Left side of head: No preauricular or posterior auricular adenopathy.     Cervical: No cervical adenopathy.     Upper Body:     Right upper body: No supraclavicular, axillary or epitrochlear adenopathy.     Left upper body: No supraclavicular, axillary or epitrochlear adenopathy.     Lower Body: No right inguinal adenopathy. No left inguinal adenopathy.  Skin:    General: Skin is warm and dry.     Findings: No lesion or rash.  Neurological:     Mental Status: He is alert and oriented to person, place, and  time.       Assessment and Plan:  Adam Little is a 22 y.o. male presenting to the [facility] Department for STI screening  1. Screening for venereal disease  - HIV [facility] - Syphilis Serology, [facility] - Chlamydia/GC NAA, Confirmation  2. Candidal balanitis -yeast covering head of penis- cream as Rx below -rash on shaft of penis- counseled to stop using the new body wash- the rash appears to be contact derm- denies itching  - clotrimazole-betamethasone (LOTRISONE) cream; Apply 1 Application topically 2 (two) times daily for 14 days.  Dispense: 30 g; Refill: 0   Patient does have STI symptoms Patient accepted all screenings including  urine GC/Chlamydia, and blood work for HIV/Syphilis. Patient meets criteria for HepB screening? No. Ordered? not applicable Patient meets criteria for HepC screening? No. Ordered? not applicable Recommended condom use with all sex Discussed importance of condom use for STI prevent  Treat positive test results per standing order. Discussed time line for [facility] results and that patient will be called with positive results and encouraged patient to call if he had not heard in 2 weeks Recommended repeat testing in 3 months with positive results. Recommended returning for continued or worsening symptoms.   Return if symptoms worsen or fail to improve, for STI screening.  No future appointments. Total time spent 20 minutes Lenice Llamas, [place]

## 2023-01-17 NOTE — Progress Notes (Signed)
Pt is here for STD screening.  The patient was dispensed clotrimazole-betamethasone (LOTRISONE) cream  today. I provided counseling today regarding the medication. We discussed the medication, the side effects and when to call clinic. Patient given the opportunity to ask questions. Questions answered.  Condoms declined

## 2023-01-21 LAB — CHLAMYDIA/GC NAA, CONFIRMATION
Chlamydia trachomatis, NAA: NEGATIVE
Neisseria gonorrhoeae, NAA: NEGATIVE

## 2023-01-28 ENCOUNTER — Telehealth: Payer: Self-pay | Admitting: Family Medicine

## 2023-01-28 NOTE — Telephone Encounter (Signed)
Please give me a call back I would like to discuss my test results

## 2023-01-28 NOTE — Telephone Encounter (Signed)
Password verified.  Pt notified of negative Chlamydia/Gonorrhea results from 4/25.  Pt also notified that we have not received his HIV and Syphilis results and it can take up to 3 weeks.  Pt verbalizes understanding.  .mecred

## 2023-02-01 DIAGNOSIS — S161XXA Strain of muscle, fascia and tendon at neck level, initial encounter: Secondary | ICD-10-CM | POA: Diagnosis not present

## 2023-03-22 IMAGING — CR DG CHEST 2V
1 series · 2 of 2 positions shown · non-contrast
Comparison: 12/28/2017

CLINICAL DATA: Cough.  Flu positive.  Shortness of breath.

EXAM:
CHEST - 2 VIEW

[Series 1: w chest pa · 0.14mm/px · 2 of 2 slices shown]
[im 1/2]
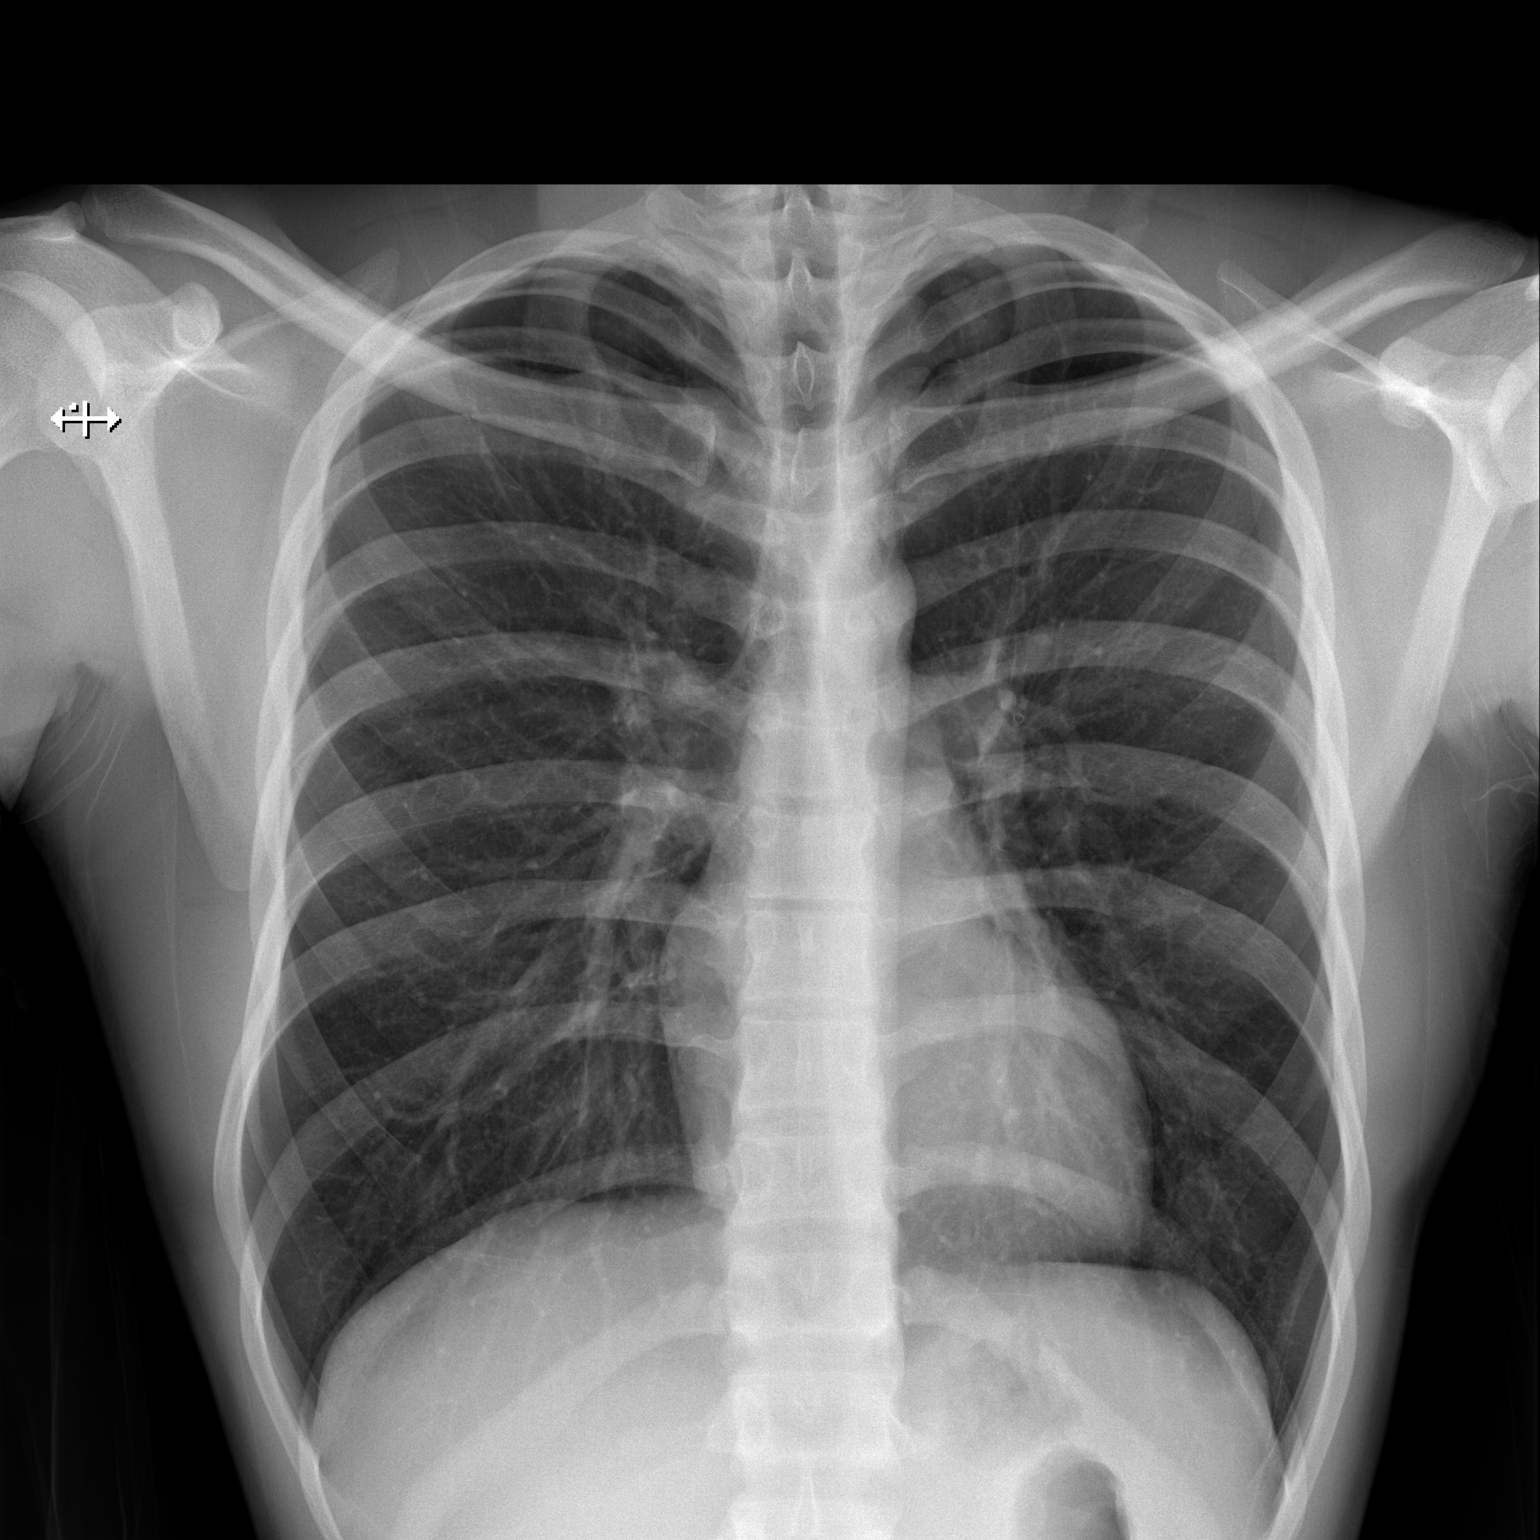
[im 2/2]
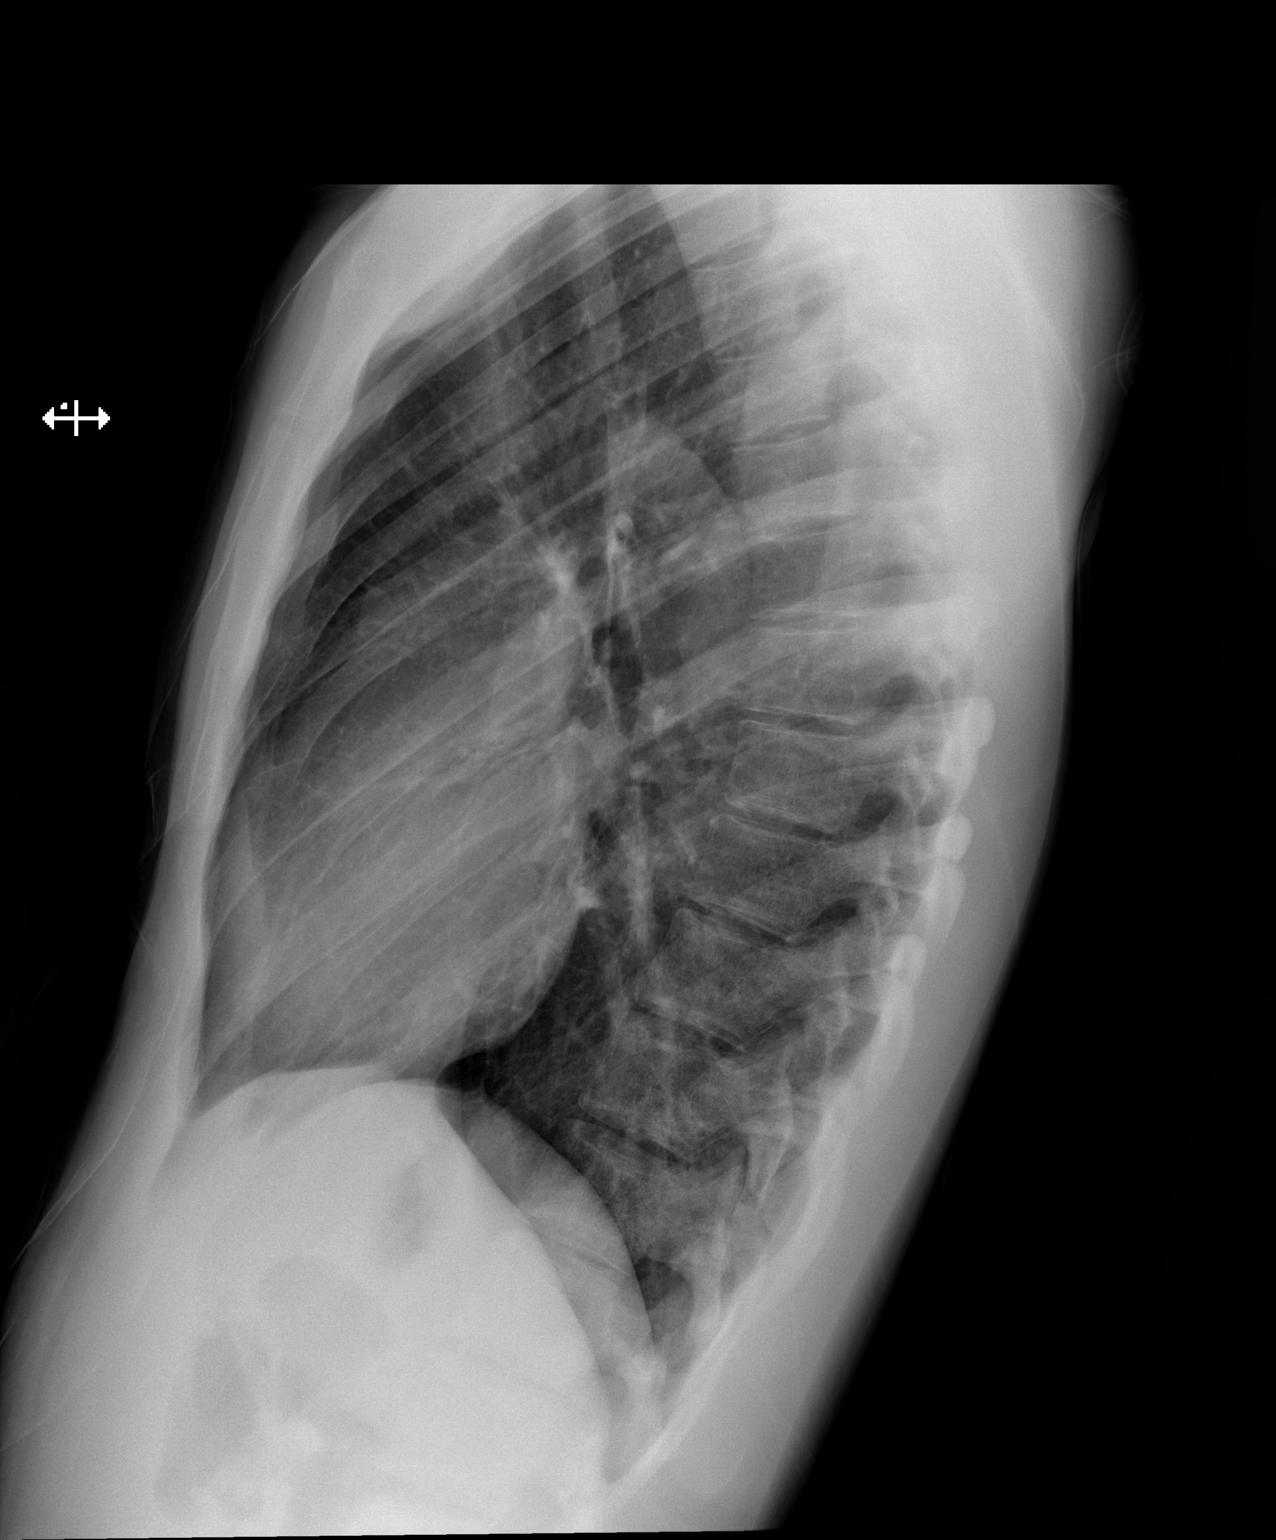

[2 of 2 positions shown; findings below may reference images not displayed]

FINDINGS: The cardiomediastinal contours are normal. The lungs are clear.
Pulmonary vasculature is normal. No consolidation, pleural effusion,
or pneumothorax. No acute osseous abnormalities are seen.
IMPRESSION: Negative radiographs of the chest.

## 2023-03-25 ENCOUNTER — Other Ambulatory Visit: Payer: Self-pay

## 2023-03-25 ENCOUNTER — Encounter: Payer: Self-pay | Admitting: Emergency Medicine

## 2023-03-25 ENCOUNTER — Emergency Department
Admission: EM | Admit: 2023-03-25 | Discharge: 2023-03-25 | Disposition: A | Discharge status: Home / Self Care | Payer: BC Managed Care – PPO | Attending: Emergency Medicine | Admitting: Emergency Medicine

## 2023-03-25 DIAGNOSIS — R21 Rash and other nonspecific skin eruption: Secondary | ICD-10-CM | POA: Insufficient documentation

## 2023-03-25 NOTE — Discharge Instructions (Signed)
Follow up with the dermatology offices that are listed on your discharge papers.  Also a list of offices taking new patients is included with your discharge instructions.  You will need to call make an appointment.  Please go to the following website to schedule new (and existing) patient appointments:   [email or web address]   The following is a list of primary care offices in the area who are accepting new patients at this time.  Please reach out to one of them directly and let them know you would like to schedule an appointment to follow up on an Emergency Department visit, and/or to establish a new primary care provider (PCP).  There are likely other primary care clinics in the are who are accepting new patients, but this is an excellent place to start:  [facility] Lead physician: Dr Shirlee Latch [address] [place], [place] [address] [phone]  [facility] Lead Physician: Dr Alba Cory [address], [place], [place] [address] [phone]  [facility]  Lead Physician: Dr Olevia Perches [address] [place], [place] [address] [phone]  [facility] Lead Physician: Dr Sofie Hartigan [address] [place], [place] [address] [phone]  [facility] at [facility] Lead Physician: Dr Bari Edward [address] [place], [place], [place] [address] [phone]

## 2023-03-25 NOTE — ED Triage Notes (Signed)
Pt to ED for rash to genitals for a few weeks. Was seen at health Department and prescribed meds but did not use them. Used coconut oil instead. Reports tested neg for STD

## 2023-03-25 NOTE — ED Provider Notes (Signed)
[facility] Provider Note    Event Date/Time   First MD Initiated Contact with Patient 03/25/23 [phone]     (approximate)   History   No chief complaint on file.   HPI  Adam Little is a 22 y.o. male   presents to the ED with complaint of rash to his penis for several weeks.  Patient was seen at the health department at which time he was tested for STDs and also given a prescription for a fungal cream.  Patient states he has not used this cream and has been using coconut oil instead.  He reports that there is no symptoms to his rash and does not seem to be spreading.  He currently does not have a PCP.  He denies any penile discharge or urinary symptoms.      Physical Exam   Triage Vital Signs: ED Triage Vitals  Enc Vitals Group     BP 03/25/23 0858 (!) 141/80     Pulse Rate 03/25/23 0857 63     Resp 03/25/23 0857 18     Temp 03/25/23 0857 98.9 F (37.2 C)     Temp src --      SpO2 03/25/23 0857 100 %     Weight --      Height --      Head Circumference --      Peak Flow --      Pain Score 03/25/23 0857 0     Pain Loc --      Pain Edu? --      Excl. in GC? --     Most recent vital signs: Vitals:   03/25/23 0857 03/25/23 0858  BP:  (!) 141/80  Pulse: 63   Resp: 18   Temp: 98.9 F (37.2 C)   SpO2: 100%      General: Awake, no distress.  CV:  Good peripheral perfusion.  Resp:  Normal effort.  Abd:  No distention.  Other:  There is a fine papular rash to the shaft of the penis without erythema or vesicles.  Nontender to palpation.  No penile discharge.  Rash is isolated to one side rather than generalized.  No discoloration.   ED Results / Procedures / Treatments   Labs (all labs ordered are listed, but only abnormal results are displayed) Labs Reviewed - No data to display    PROCEDURES:  Critical Care performed:   Procedures   MEDICATIONS ORDERED IN ED: Medications - No data to display   IMPRESSION / MDM /  ASSESSMENT AND PLAN / ED COURSE  I reviewed the triage vital signs and the nursing notes.   Differential diagnosis includes, but is not limited to, genital herpes, contact dermatitis, cellulitis, fungal infection, rash etiology uncertain.  22 year old male presents to the ED with complaint of rash to his penis for the last few weeks without symptoms.  Patient was seen at the health department at which time he was given a fungal cream which she has not used.  I suggested that he be followed up with a dermatologist as I have ruled out the most common issues that could cause his rash.  He was also given list of dermatology along with primary care providers.      Patient's presentation is most consistent with acute, uncomplicated illness.  FINAL CLINICAL IMPRESSION(S) / ED DIAGNOSES   Final diagnoses:  Rash and nonspecific skin eruption     Rx / DC Orders   ED Discharge  Orders     None        Note:  This document was prepared using Dragon voice recognition software and may include unintentional dictation errors.   Tommi Rumps, PA-C 03/25/23 1514    Pilar Jarvis, MD 03/25/23 [phone]

## 2023-04-07 NOTE — Progress Notes (Unsigned)
New patient visit  Patient: Adam Little   DOB: [birth year]   22 y.o. Male  MRN: [id number] Visit Date: 04/08/2023  Today's healthcare provider: Debera Lat, PA-C   No chief complaint on file.  Subjective    Adam Little is a 22 y.o. male who presents today as a new patient to establish care.  HPI  *** Discussed the use of AI scribe software for clinical note transcription with the patient, who gave verbal consent to proceed.  History of Present Illness            Past Medical History:  Diagnosis Date   Asthma    Past Surgical History:  Procedure Laterality Date   OPEN REDUCTION INTERNAL FIXATION (ORIF) PROXIMAL PHALANX Left 05/21/2017   Procedure: OPEN REDUCTION INTERNAL FIXATION (ORIF) PROXIMAL PHALANX;  Surgeon: Kennedy Bucker, MD;  Location: [facility] ORS;  Service: Orthopedics;  Laterality: Left;   WISDOM TOOTH EXTRACTION     Family Status  Relation Name Status   Mother  Alive   Father  Alive  No partnership data on file   No family history on file. Social History   Socioeconomic History   Marital status: Single    Spouse name: Not on file   Number of children: Not on file   Years of education: Not on file   Highest education level: Not on file  Occupational History   Not on file  Tobacco Use   Smoking status: Never   Smokeless tobacco: Never  Vaping Use   Vaping status: Never Used  Substance and Sexual Activity   Alcohol use: No   Drug use: Yes    Frequency: 4.0 times per week    Types: Marijuana   Sexual activity: Yes    Partners: Female    Birth control/protection: Condom  Other Topics Concern   Not on file  Social History Narrative   Not on file   Social Determinants of Health   Financial Resource Strain: Not on file  Food Insecurity: Not on file  Transportation Needs: Not on file  Physical Activity: Not on file  Stress: Not on file  Social Connections: Not on file   Outpatient Medications Prior to Visit  Medication Sig    albuterol (PROVENTIL HFA;VENTOLIN HFA) 108 (90 BASE) MCG/ACT inhaler Inhale 1-2 puffs into the lungs every 6 (six) hours as needed for wheezing or shortness of breath.   beclomethasone (QVAR) 40 MCG/ACT inhaler Inhale 1 puff into the lungs 2 (two) times daily as needed.    No facility-administered medications prior to visit.   No Known Allergies   There is no immunization history on file for this patient.  Health Maintenance  Topic Date Due   HPV VACCINES (1 - Male 3-dose series) Never done   Hepatitis C Screening  Never done   DTaP/Tdap/Td (1 - Tdap) Never done   COVID-19 Vaccine (1 - 2023-24 season) Never done   INFLUENZA VACCINE  04/25/2023   HIV Screening  Completed    Patient Care Team: Pcp, No as PCP - General  Review of Systems  Constitutional:  Negative for diaphoresis.  All other systems reviewed and are negative.  Except see HPI   {Insert previous labs (optional):23779}  {See past labs  Heme  Chem  Endocrine  Serology  Results Review (optional):1}   Objective    There were no vitals taken for this visit. {Insert last BP/Wt (optional):23777}  {See vitals history (optional):1}  Physical Exam Vitals reviewed.  Constitutional:  General: He is not in acute distress.    Appearance: Normal appearance. He is not diaphoretic.  HENT:     Head: Normocephalic and atraumatic.  Eyes:     General: No scleral icterus.    Conjunctiva/sclera: Conjunctivae normal.  Cardiovascular:     Rate and Rhythm: Normal rate and regular rhythm.     Pulses: Normal pulses.     Heart sounds: Normal heart sounds. No murmur heard. Pulmonary:     Effort: Pulmonary effort is normal. No respiratory distress.     Breath sounds: Normal breath sounds. No wheezing or rhonchi.  Musculoskeletal:     Cervical back: Neck supple.     Right lower leg: No edema.     Left lower leg: No edema.  Lymphadenopathy:     Cervical: No cervical adenopathy.  Skin:    General: Skin is warm and  dry.     Findings: No rash.  Neurological:     Mental Status: He is alert and oriented to person, place, and time. Mental status is at baseline.  Psychiatric:        Mood and Affect: Mood normal.        Behavior: Behavior normal.   Depression Screen     No data to display         No results found for any visits on 04/08/23.  Assessment & Plan     *** Assessment and Plan              Encounter to establish care Welcomed to our clinic Reviewed past medical hx, social hx, family hx and surgical hx Pt advised to send all vaccination records or screening   No follow-ups on file.    The patient was advised to call back or seek an in-person evaluation if the symptoms worsen or if the condition fails to improve as anticipated.  I discussed the assessment and treatment plan with the patient. The patient was provided an opportunity to ask questions and all were answered. The patient agreed with the plan and demonstrated an understanding of the instructions.  I, Debera Lat, PA-C have reviewed all documentation for this visit. The documentation on  04/08/23  for the exam, diagnosis, procedures, and orders are all accurate and complete.  Debera Lat, [facility], MMS [facility] [phone] (phone) [phone] (fax)  [facility]

## 2023-04-08 ENCOUNTER — Encounter: Payer: Self-pay | Admitting: Physician Assistant

## 2023-04-08 ENCOUNTER — Ambulatory Visit (INDEPENDENT_AMBULATORY_CARE_PROVIDER_SITE_OTHER): Payer: BC Managed Care – PPO | Admitting: Physician Assistant

## 2023-04-08 VITALS — BP 117/74 | HR 81 | Ht 72.0 in | Wt 167.6 lb

## 2023-04-08 DIAGNOSIS — R21 Rash and other nonspecific skin eruption: Secondary | ICD-10-CM

## 2023-04-08 DIAGNOSIS — J4520 Mild intermittent asthma, uncomplicated: Secondary | ICD-10-CM

## 2023-04-08 DIAGNOSIS — Z7689 Persons encountering health services in other specified circumstances: Secondary | ICD-10-CM | POA: Diagnosis not present

## 2023-04-08 MED ORDER — ALBUTEROL SULFATE HFA 108 (90 BASE) MCG/ACT IN AERS: 2.0000 | INHALATION_SPRAY | Freq: Four times a day (QID) | RESPIRATORY_TRACT | 2 refills | Status: AC | PRN

## 2023-04-09 DIAGNOSIS — J4520 Mild intermittent asthma, uncomplicated: Secondary | ICD-10-CM | POA: Insufficient documentation

## 2023-04-09 DIAGNOSIS — R21 Rash and other nonspecific skin eruption: Secondary | ICD-10-CM | POA: Insufficient documentation

## 2023-04-15 ENCOUNTER — Encounter: Payer: Self-pay | Admitting: Physician Assistant

## 2023-05-02 ENCOUNTER — Ambulatory Visit: Payer: Medicaid Other | Admitting: Urology

## 2023-05-04 NOTE — Progress Notes (Deleted)
Complete physical exam  Patient: Adam Little   DOB: 2001-04-07   22 y.o. Male  MRN: [id number] Visit Date: 05/08/2023  Today's healthcare provider: Debera Lat, PA-C   No chief complaint on file.  Subjective    Adam Little is a 22 y.o. male who presents today for a complete physical exam.  He reports consuming a {diet types:17450} diet. {Exercise:19826} He generally feels {well/fairly well/poorly:18703}. He reports sleeping {well/fairly well/poorly:18703}. He {does/does not:200015} have additional problems to discuss today.  HPI  *** Discussed the use of AI scribe software for clinical note transcription with the patient, who gave verbal consent to proceed.  History of Present Illness            Last depression screening scores    04/08/2023    2:10 PM  PHQ 2/9 Scores  PHQ - 2 Score 0   Last fall risk screening    04/08/2023    2:10 PM  Fall Risk   Falls in the past year? 0  Number falls in past yr: 0  Injury with Fall? 0  Risk for fall due to : No Fall Risks  Follow up Falls prevention discussed;Falls evaluation completed   Last Audit-C alcohol use screening     No data to display         A score of 3 or more in women, and 4 or more in men indicates increased risk for alcohol abuse, EXCEPT if all of the points are from question 1   Past Medical History:  Diagnosis Date   Asthma    Past Surgical History:  Procedure Laterality Date   OPEN REDUCTION INTERNAL FIXATION (ORIF) PROXIMAL PHALANX Left 05/21/2017   Procedure: OPEN REDUCTION INTERNAL FIXATION (ORIF) PROXIMAL PHALANX;  Surgeon: Kennedy Bucker, MD;  Location: [facility] ORS;  Service: Orthopedics;  Laterality: Left;   WISDOM TOOTH EXTRACTION     Social History   Socioeconomic History   Marital status: Single    Spouse name: Not on file   Number of children: Not on file   Years of education: Not on file   Highest education level: Not on file  Occupational History   Not on file  Tobacco  Use   Smoking status: Never   Smokeless tobacco: Never  Vaping Use   Vaping status: Never Used  Substance and Sexual Activity   Alcohol use: No   Drug use: Yes    Frequency: 4.0 times per week    Types: Marijuana   Sexual activity: Yes    Partners: Female    Birth control/protection: Condom  Other Topics Concern   Not on file  Social History Narrative   Not on file   Social Determinants of Health   Financial Resource Strain: Not on file  Food Insecurity: Not on file  Transportation Needs: Not on file  Physical Activity: Not on file  Stress: Not on file  Social Connections: Not on file  Intimate Partner Violence: Not At Risk (01/23/2021)   Humiliation, Afraid, Rape, and Kick questionnaire    Fear of Current or Ex-Partner: No    Emotionally Abused: No    Physically Abused: No    Sexually Abused: No   Family Status  Relation Name Status   Mother  Alive   Father  Alive  No partnership data on file   No family history on file. No Known Allergies  Patient Care Team: Cherlynn Polo as PCP - General (Physician Assistant)   Medications: Outpatient  Medications Prior to Visit  Medication Sig   albuterol (VENTOLIN HFA) 108 (90 Base) MCG/ACT inhaler Inhale 2 puffs into the lungs every 6 (six) hours as needed for wheezing or shortness of breath.   beclomethasone (QVAR) 40 MCG/ACT inhaler Inhale 1 puff into the lungs 2 (two) times daily as needed.  (Patient not taking: Reported on 04/08/2023)   No facility-administered medications prior to visit.    Review of Systems  All other systems reviewed and are negative.  Except see HPI  {Insert previous labs (optional):23779} {See past labs  Heme  Chem  Endocrine  Serology  Results Review (optional):1}  Objective    There were no vitals taken for this visit. {Insert last BP/Wt (optional):23777}{See vitals history (optional):1}    Physical Exam Vitals reviewed.  Constitutional:      General: He is not in acute  distress.    Appearance: Normal appearance. He is well-developed. He is not ill-appearing, toxic-appearing or diaphoretic.  HENT:     Head: Normocephalic and atraumatic.     Right Ear: Tympanic membrane, ear canal and external ear normal.     Left Ear: Tympanic membrane, ear canal and external ear normal.     Nose: Nose normal. No congestion or rhinorrhea.     Mouth/Throat:     Mouth: Mucous membranes are moist.     Pharynx: Oropharynx is clear. No oropharyngeal exudate.  Eyes:     General: No scleral icterus.       Right eye: No discharge.        Left eye: No discharge.     Conjunctiva/sclera: Conjunctivae normal.     Pupils: Pupils are equal, round, and reactive to light.  Neck:     Thyroid: No thyromegaly.     Vascular: No carotid bruit.  Cardiovascular:     Rate and Rhythm: Normal rate and regular rhythm.     Pulses: Normal pulses.     Heart sounds: Normal heart sounds. No murmur heard.    No friction rub. No gallop.  Pulmonary:     Effort: Pulmonary effort is normal. No respiratory distress.     Breath sounds: Normal breath sounds. No wheezing or rales.  Abdominal:     General: Abdomen is flat. Bowel sounds are normal. There is no distension.     Palpations: Abdomen is soft. There is no mass.     Tenderness: There is no abdominal tenderness. There is no right CVA tenderness, left CVA tenderness, guarding or rebound.     Hernia: No hernia is present.  Musculoskeletal:        General: No swelling, tenderness, deformity or signs of injury. Normal range of motion.     Cervical back: Normal range of motion and neck supple. No rigidity or tenderness.     Right lower leg: No edema.     Left lower leg: No edema.  Lymphadenopathy:     Cervical: No cervical adenopathy.  Skin:    General: Skin is warm and dry.     Coloration: Skin is not jaundiced or pale.     Findings: No bruising, erythema, lesion or rash.  Neurological:     Mental Status: He is alert and oriented to person,  place, and time. Mental status is at baseline.     Gait: Gait normal.  Psychiatric:        Mood and Affect: Mood normal.        Behavior: Behavior normal.        Thought Content:  Thought content normal.        Judgment: Judgment normal.      No results found for any visits on 05/08/23.  Assessment & Plan    Routine Health Maintenance and Physical Exam  Exercise Activities and Dietary recommendations  Goals   None     Immunization History  Administered Date(s) Administered   HPV 9-valent 12/09/2013   Hpv-Unspecified 01/08/2015   Td (Adult),unspecified 10/23/2010   Tdap 02/19/2000, 04/08/2000, 01/24/2001, 10/28/2003, 10/23/2010    Health Maintenance  Topic Date Due   Hepatitis C Screening  Never done   DTaP/Tdap/Td vaccine (2 - Td or Tdap) 10/23/2020   COVID-19 Vaccine (1 - 2023-24 season) Never done   Flu Shot  04/25/2023   HPV Vaccine  Completed   HIV Screening  Completed    Discussed health benefits of physical activity, and encouraged him to engage in regular exercise appropriate for his age and condition. Annual physical exam UTD on dental/eye Things to do to keep yourself healthy  - Exercise at least 30-45 minutes a day, 3-4 days a week.  - Eat a low-fat diet with lots of fruits and vegetables, up to 7-9 servings per day.  - Seatbelts can save your life. Wear them always.  - Smoke detectors on every level of your home, check batteries every year.  - Eye Doctor - have an eye exam every 1-2 years  - Safe sex - if you may be exposed to STDs, use a condom.  - Alcohol -  If you drink, do it moderately, less than 2 drinks per day.  - Health Care Power of Attorney. Choose someone to speak for you if you are not able.  - Depression is common in our stressful world.If you're feeling down or losing interest in things you normally enjoy, please come in for a visit.  - Violence - If anyone is threatening or hurting you, please call immediately.  ***  No follow-ups on  file.    The patient was advised to call back or seek an in-person evaluation if the symptoms worsen or if the condition fails to improve as anticipated.  I discussed the assessment and treatment plan with the patient. The patient was provided an opportunity to ask questions and all were answered. The patient agreed with the plan and demonstrated an understanding of the instructions.  I, Debera Lat, PA-C have reviewed all documentation for this visit. The documentation on  05/08/23  for the exam, diagnosis, procedures, and orders are all accurate and complete.  Debera Lat, [facility], MMS [facility] [phone] (phone) [phone] (fax)  [facility]

## 2023-05-08 ENCOUNTER — Encounter: Payer: Medicaid Other | Admitting: Physician Assistant

## 2023-05-08 DIAGNOSIS — Z0000 Encounter for general adult medical examination without abnormal findings: Secondary | ICD-10-CM

## 2023-06-25 DIAGNOSIS — J069 Acute upper respiratory infection, unspecified: Secondary | ICD-10-CM | POA: Diagnosis not present

## 2023-06-25 DIAGNOSIS — Z03818 Encounter for observation for suspected exposure to other biological agents ruled out: Secondary | ICD-10-CM | POA: Diagnosis not present

## 2023-09-05 DIAGNOSIS — J209 Acute bronchitis, unspecified: Secondary | ICD-10-CM | POA: Diagnosis not present

## 2023-09-05 DIAGNOSIS — Z03818 Encounter for observation for suspected exposure to other biological agents ruled out: Secondary | ICD-10-CM | POA: Diagnosis not present

## 2023-09-05 DIAGNOSIS — J019 Acute sinusitis, unspecified: Secondary | ICD-10-CM | POA: Diagnosis not present

## 2023-09-05 DIAGNOSIS — B9689 Other specified bacterial agents as the cause of diseases classified elsewhere: Secondary | ICD-10-CM | POA: Diagnosis not present

## 2023-12-05 DIAGNOSIS — S86812A Strain of other muscle(s) and tendon(s) at lower leg level, left leg, initial encounter: Secondary | ICD-10-CM | POA: Diagnosis not present

## 2024-01-27 DIAGNOSIS — M7750 Other enthesopathy of unspecified foot: Secondary | ICD-10-CM | POA: Diagnosis not present

## 2024-01-27 DIAGNOSIS — M25571 Pain in right ankle and joints of right foot: Secondary | ICD-10-CM | POA: Diagnosis not present

## 2024-02-21 ENCOUNTER — Other Ambulatory Visit: Payer: Self-pay

## 2024-02-21 ENCOUNTER — Emergency Department
Admission: EM | Admit: 2024-02-21 | Discharge: 2024-02-21 | Disposition: A | Discharge status: Home / Self Care | Attending: Emergency Medicine | Admitting: Emergency Medicine

## 2024-02-21 ENCOUNTER — Encounter: Payer: Self-pay | Admitting: Emergency Medicine

## 2024-02-21 DIAGNOSIS — M25561 Pain in right knee: Secondary | ICD-10-CM | POA: Insufficient documentation

## 2024-02-21 MED ORDER — NAPROXEN 500 MG PO TABS: 500.0000 mg | ORAL_TABLET | Freq: Two times a day (BID) | ORAL | 2 refills | Status: DC

## 2024-02-21 MED ORDER — NAPROXEN 500 MG PO TABS: 500.0000 mg | ORAL_TABLET | Freq: Two times a day (BID) | ORAL | 0 refills | Status: AC

## 2024-02-21 NOTE — ED Provider Notes (Signed)
 [facility] Provider Note    Event Date/Time   First MD Initiated Contact with Patient 02/21/24 1124     (approximate)   History   Knee Pain   HPI  Adam Little is a 23 y.o. male presenting to the emergency department with right knee pain since Tuesday.  States it feels like pins-and-needles.  Reports being recently seen in urgent care on 5/5 for right ankle tendinitis and was placed in a cam boot, given naproxen for 7 days.  He recently stopped wearing the boot and has been trying to strengthen his leg muscles with at home body exercises.  States the pain in his knee started a few days after not wearing the boot.  Denies fever, erythema, edema, puslike drainage, injury, fall, dysuria, vision changes, chest pain, dyspnea.  He is not an athlete.  He has not been lifting heavy weights recently or doing cardio exercises.     Physical Exam   Triage Vital Signs: ED Triage Vitals  Encounter Vitals Group     BP 02/21/24 1037 119/64     Systolic BP Percentile --      Diastolic BP Percentile --      Pulse Rate 02/21/24 1037 88     Resp 02/21/24 1037 17     Temp 02/21/24 1037 98.7 F (37.1 C)     Temp Source 02/21/24 1037 Oral     SpO2 02/21/24 1037 94 %     Weight 02/21/24 1038 170 lb (77.1 kg)     Height 02/21/24 1038 6' (1.829 m)     Head Circumference --      Peak Flow --      Pain Score 02/21/24 1044 0     Pain Loc --      Pain Education --      Exclude from Growth Chart --     Most recent vital signs: Vitals:   02/21/24 1037  BP: 119/64  Pulse: 88  Resp: 17  Temp: 98.7 F (37.1 C)  SpO2: 94%    General: Awake, no distress.  CV:  Good peripheral perfusion.  Resp:  Normal effort.  Abd:  No distention.  Other:  Grossly normal range of motion, 5/5 strength, and good sensation L4 L5-S1 bilaterally in lower extremities.  Negative straight leg raise, negative McMurray's, negative valgus and varus testing, negative Lachman's bilaterally.  No  rash, erythema, edema, deformities noticed at knees or ankles.  Tender to palpation at distal portion of quadriceps muscles on right leg that extended into the patellar region.  Ambulatory with ease.  Can heel-to-toe walk with ease.   ED Results / Procedures / Treatments   Labs (all labs ordered are listed, but only abnormal results are displayed) Labs Reviewed - No data to display   EKG    RADIOLOGY   PROCEDURES:  Critical Care performed: No  Procedures   MEDICATIONS ORDERED IN ED: Medications - No data to display   IMPRESSION / MDM / ASSESSMENT AND PLAN / ED COURSE  I reviewed the triage vital signs and the nursing notes.                              Differential diagnosis includes, but is not limited to, quadriceps strain, nerve injury, ligamentous injury, patellofemoral pain syndrome, quadriceps rupture  Patient's presentation is most consistent with acute, uncomplicated illness.  Patient's presentation is most consistent with acute right knee pain  likely due to change in gait pattern and completing strengthening exercises following discontinuation of wearing his boot worn for recent right ankle tendinitis earlier this month.  Physical exam is benign.  Considering his history, no fall or injury, and physical exam more consistent with musculoskeletal strain or nerve injury, I did not think a x-ray was warranted at this time.  We discussed continuing his exercises as tolerated and using naproxen for the next 7 days to help with any inflammation that may be seen in his quadriceps muscle.  He can also use ice for swelling for a few days, followed by heat afterwards.  If he is not feeling better after 7 days, he can follow-up with the orthopedics at [facility].  Emergency department return precautions were discussed with the patient.  Patient is in agreement to the treatment plan.  Patient is stable for discharge.    FINAL CLINICAL IMPRESSION(S) / ED DIAGNOSES   Final  diagnoses:  Acute pain of right knee     Rx / DC Orders   ED Discharge Orders          Ordered    naproxen (NAPROSYN) 500 MG tablet  2 times daily with meals,   Status:  Discontinued        02/21/24 1206    naproxen (NAPROSYN) 500 MG tablet  2 times daily with meals        02/21/24 1206             Note:  This document was prepared using Dragon voice recognition software and may include unintentional dictation errors.    Jorie Newness [place], PA-C 02/21/24 1422    Bradler, Evan K, MD 02/21/24 [phone]

## 2024-02-21 NOTE — ED Triage Notes (Signed)
 Patient to ED via POV for right knee pain. States when he rubs it he has a pins and needles feeling. Ongoing for a few days. States he recently can out of a boot on that leg.

## 2024-02-21 NOTE — Discharge Instructions (Addendum)
 We believe that your symptoms are caused by musculoskeletal strain.  Please read through the included information about additional care such as heating pads, over-the-counter pain medicine.  If you were provided a prescription please use it only as needed and as instructed.  Remember that early mobility and using the affected part of your body is actually better than keeping it immobile.  Please do not take any ibuprofen , Aleve, Advil , or Motrin  while on this medication.  Follow-up with the doctor listed as recommended or return to the emergency department with new or worsening symptoms that concern you.

## 2024-03-13 DIAGNOSIS — Z23 Encounter for immunization: Secondary | ICD-10-CM | POA: Diagnosis not present

## 2024-03-13 DIAGNOSIS — S0181XA Laceration without foreign body of other part of head, initial encounter: Secondary | ICD-10-CM | POA: Diagnosis not present

## 2024-06-09 DIAGNOSIS — R509 Fever, unspecified: Secondary | ICD-10-CM | POA: Diagnosis not present
